# Patient Record
Sex: Female | Born: 1965 | Race: White | Hispanic: No | Marital: Married | Smoking: Never smoker
Health system: Southern US, Academic
[De-identification: ages and names within clinical notes are randomized; demographics above are authoritative.]

## PROBLEM LIST (undated history)

## (undated) DIAGNOSIS — G35 Multiple sclerosis: Secondary | ICD-10-CM

## (undated) DIAGNOSIS — F419 Anxiety disorder, unspecified: Secondary | ICD-10-CM

## (undated) DIAGNOSIS — J45909 Unspecified asthma, uncomplicated: Secondary | ICD-10-CM

## (undated) DIAGNOSIS — G2581 Restless legs syndrome: Secondary | ICD-10-CM

## (undated) DIAGNOSIS — F32A Depression, unspecified: Secondary | ICD-10-CM

## (undated) HISTORY — DX: Anxiety disorder, unspecified: F41.9

## (undated) HISTORY — DX: Multiple sclerosis (CMS HCC): G35

## (undated) HISTORY — PX: BUTTOCK MASS EXCISION: SHX1279

## (undated) HISTORY — PX: FINGER SURGERY: SHX640

## (undated) HISTORY — DX: Unspecified asthma, uncomplicated: J45.909

## (undated) HISTORY — DX: Depression, unspecified: F32.A

## (undated) HISTORY — DX: Restless legs syndrome: G25.81

---

## 1990-09-17 ENCOUNTER — Other Ambulatory Visit (HOSPITAL_COMMUNITY): Payer: Self-pay

## 2021-03-25 IMAGING — MR MRI CERVICAL SPINE WITHOUT CONTRAST
4 of 6 series · 22 of 48 positions shown · IV contrast (gadolinium)
Comparison: None available.

﻿EXAM:  [id number]   MRI CERVICAL SPINE WITHOUT CONTRAST
INDICATION: History of multiple sclerosis.
TECHNIQUE: Multiplanar multisequential MRI of the cervical spine was performed without gadolinium contrast.

[Series 5: T2 · sagittal · 3.0mm · 0.75mm/px · 6 of 15 slices shown (1 of 3)]
[im 1/15]
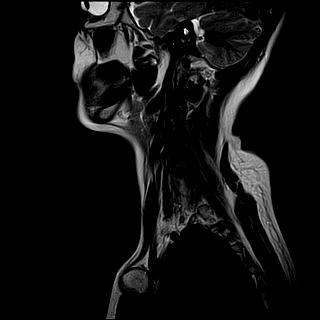
[im 3/15]
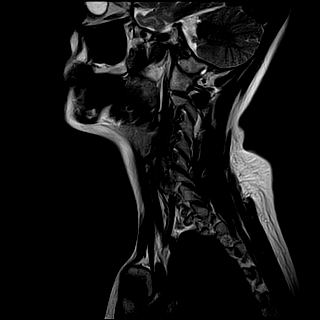
[im 6/15]
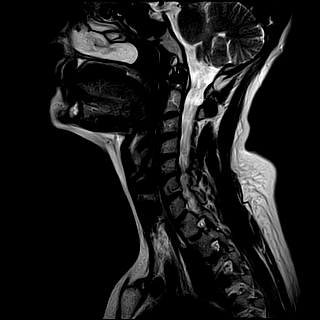
[im 9/15]
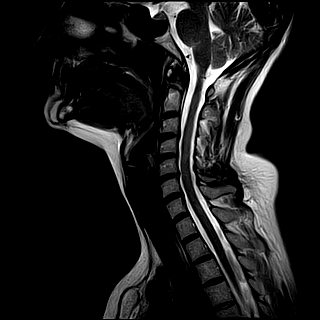
[im 12/15]
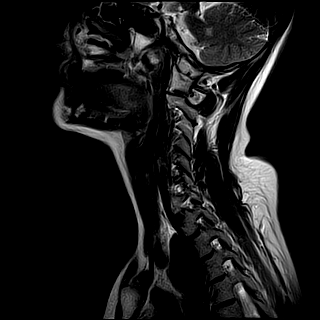
[im 15/15]
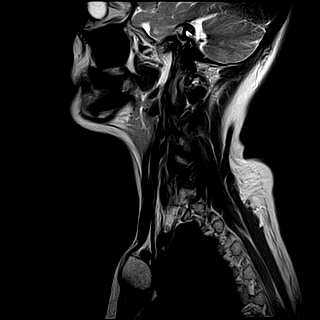

[Series 6: T1 · sagittal · 3.0mm · 0.47mm/px · 3 of 15 slices shown]
[im 3/15]
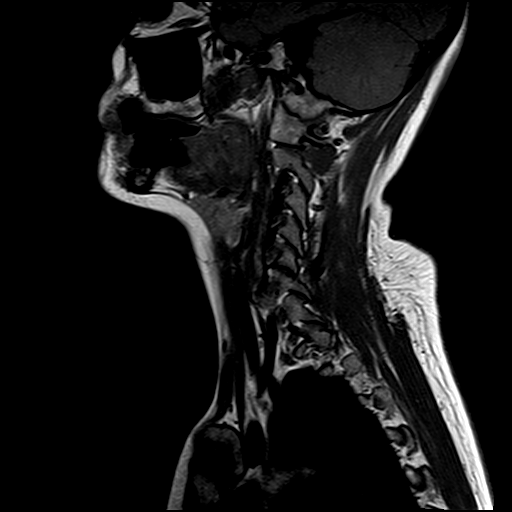
[im 8/15]
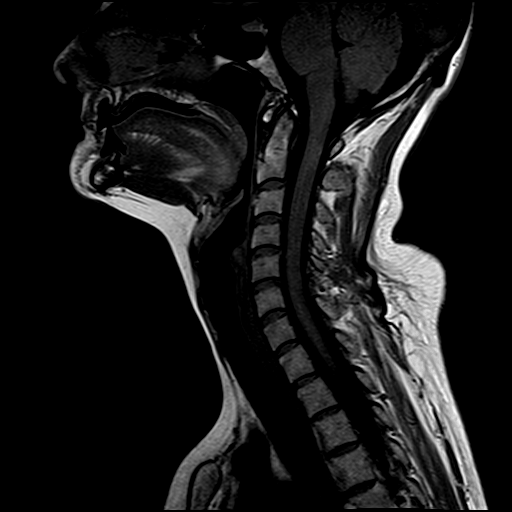
[im 12/15]
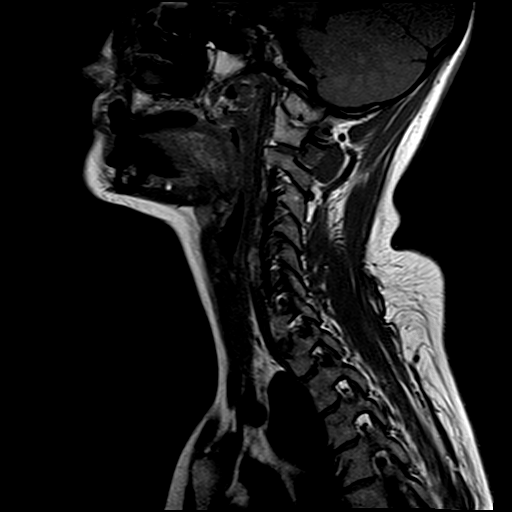

[Series 10: T2 · axial · 3.0mm · 0.39mm/px · z∈[-80,+17]mm · 8 of 18 slices shown (2 of 3)]
[im 1/18]
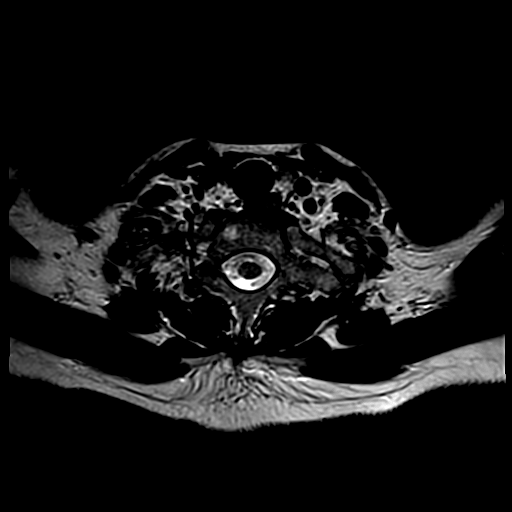
[im 3/18]
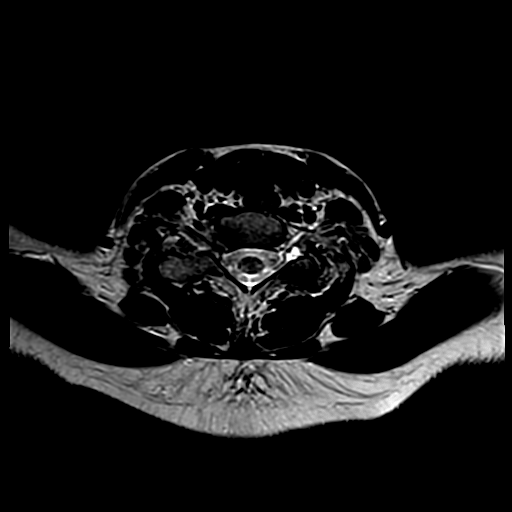
[im 5/18]
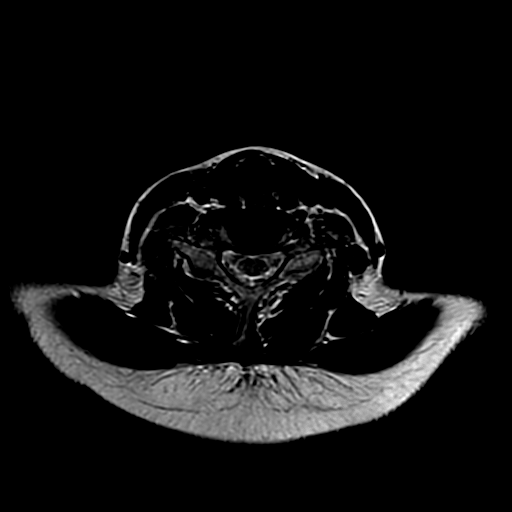
[im 8/18]
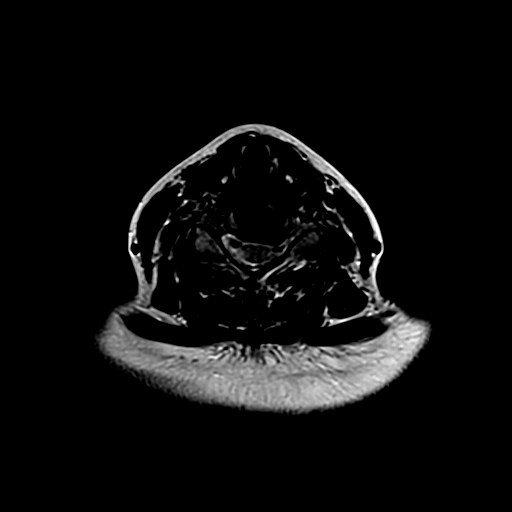
[im 10/18]
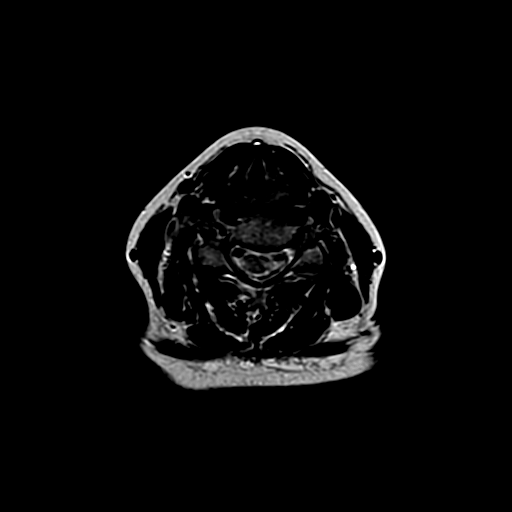
[im 13/18]
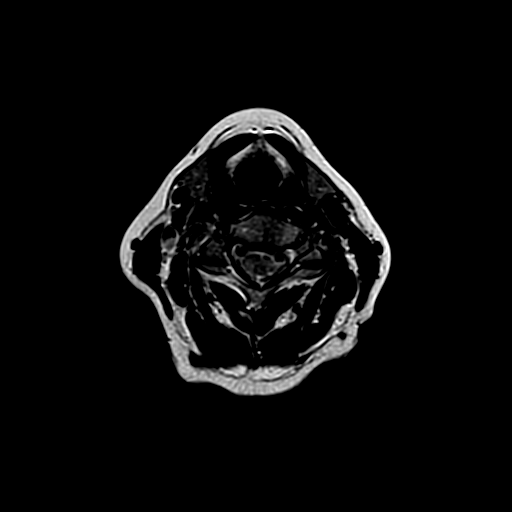
[im 15/18]
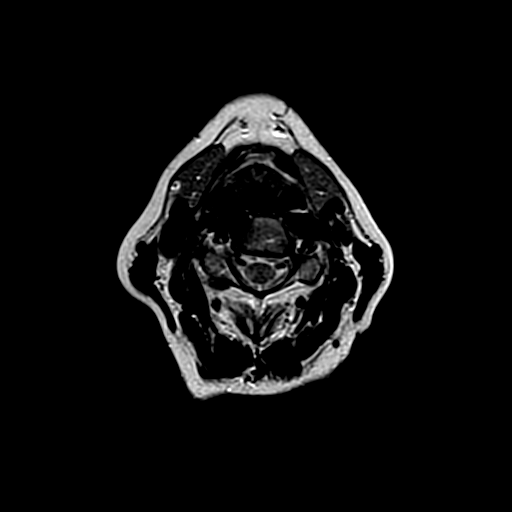
[im 18/18]
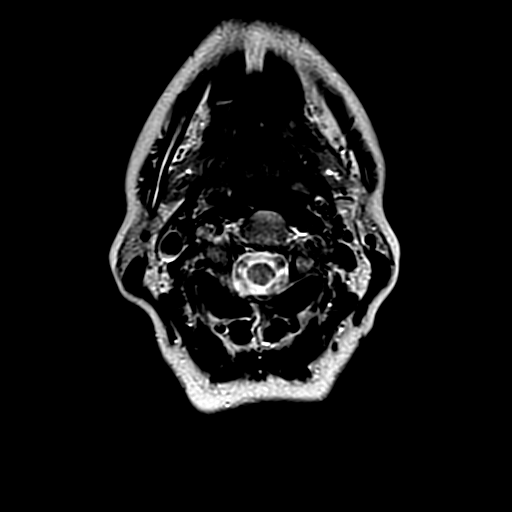

[Series 12: T2 · axial · 4.0mm · 0.35mm/px · z∈[-75,+34]mm · 5 of 26 slices shown (3 of 3)]
[im 1/26]
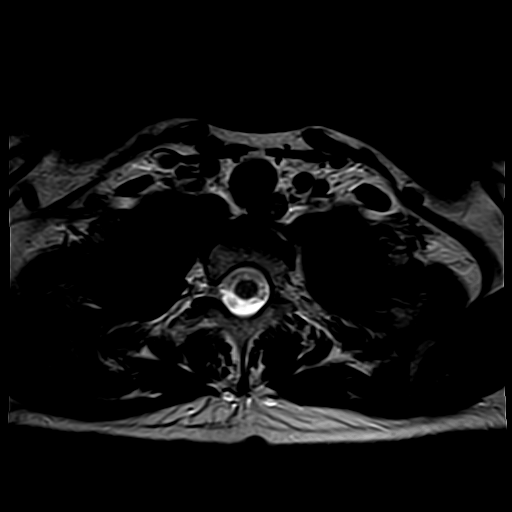
[im 5/26]
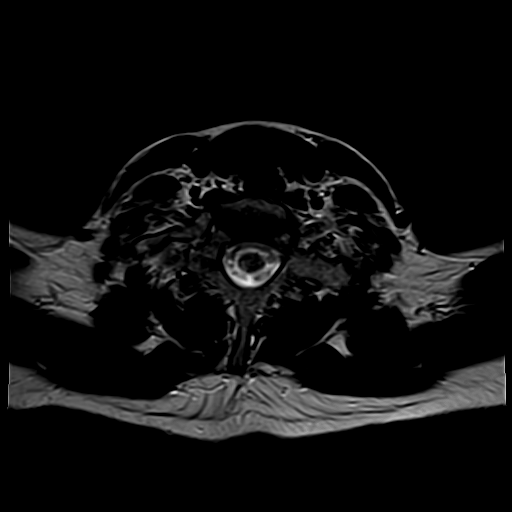
[im 7/26]
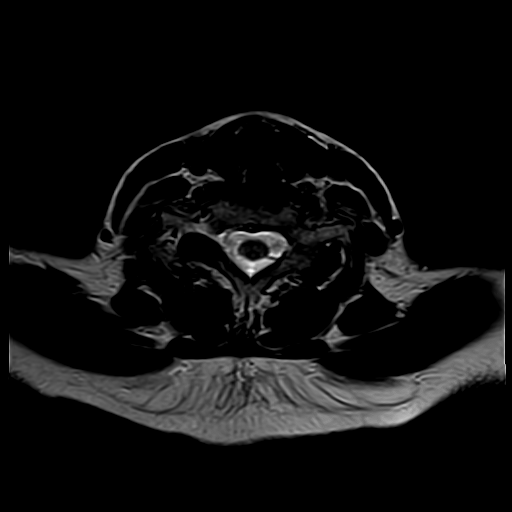
[im 14/26]
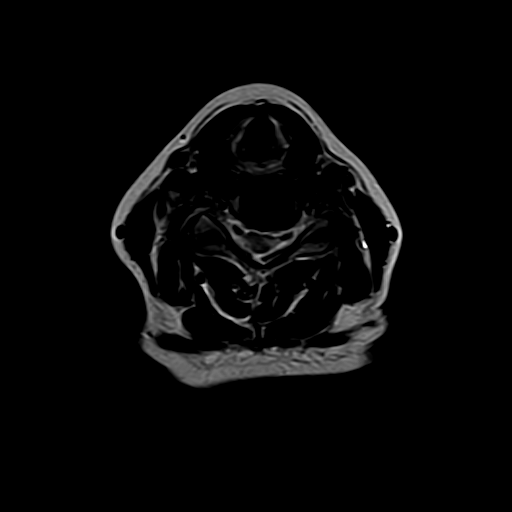
[im 23/26]
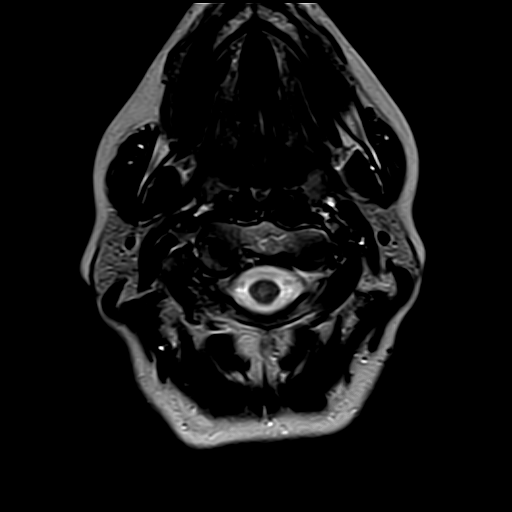

[22 of 48 positions shown; findings below may reference images not displayed]

FINDINGS: Vertebral bodies are normal in height, alignment and signal intensity. There is no acute fracture or subluxation. There is a 14 mm demyelinating plaque within the spinal cord at the level of C2 vertebral body. There is suggestion of another 7.5 mm plaque within the spinal cord at the level of C7-T1 disc space. There is also suggestion of a 5 mm demyelinating plaque within the left aspect of the spinal cord at the level of C4-5 disc space.

At C2-3 level, there is mild-to-moderate right neural foraminal stenosis from facet and uncovertebral joint hypertrophy.

C3-4 level is unremarkable.

At C4-5 level, there is a small broad-based central disc bulge with near complete effacement of the ventral CSF. There is no significant neural foraminal stenosis.

At C5-6 level, there is a small broad-based central disc bulge with near complete effacement of the ventral CSF. There is mild left neural foraminal stenosis from facet and uncovertebral joint hypertrophy.

C6-7, C7-T1 level and paraspinal soft tissues are unremarkable.
IMPRESSION: 1. At least 3 demyelinating plaques within the spinal cord as detailed above. 

2. Near complete effacement of the ventral CSF from small central disc bulges at C4-5 and C5-6 levels. 

3. Mild-to-moderate right neural foraminal stenosis at C2-3 level from facet and uncovertebral joint hypertrophy.

## 2021-03-25 IMAGING — MR MRI BRAIN WITHOUT AND WITH CONTRAST
10 of 13 series · 33 of 48 positions shown · IV contrast (gadavist)
Comparison: Brain MRI dated 11/14/2011.

﻿EXAM:  [id number]   MRI BRAIN WITHOUT AND WITH CONTRAST
INDICATION: History of multiple sclerosis, follow-up exam.
TECHNIQUE: Multiplanar multisequential MRI of the brain was performed without and with 4 mL of Gadavist.

[Series 5: DWI · axial · 5.0mm · 1.35mm/px · z∈[-46,+80]mm · 8 of 88 slices shown (1 of 3)]
[im 1/88]
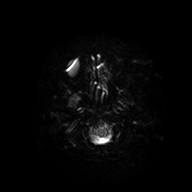
[im 13/88]
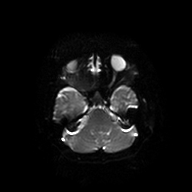
[im 25/88]
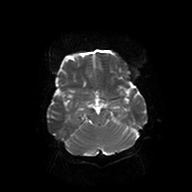
[im 38/88]
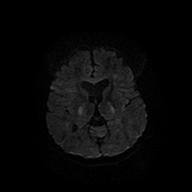
[im 50/88]
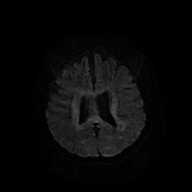
[im 63/88]
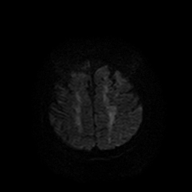
[im 75/88]
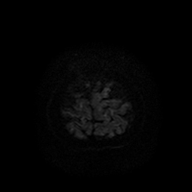
[im 88/88]
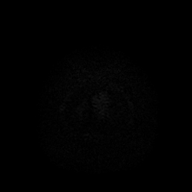

[Series 6: DWI · axial · 5.0mm · 1.35mm/px · z∈[-46,+80]mm · 2 of 22 slices shown (2 of 3)]
[im 1/22]
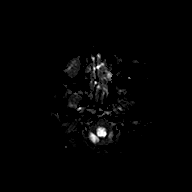
[im 22/22]
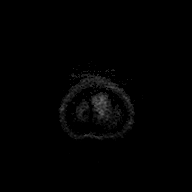

[Series 7: DWI · axial · 5.0mm · 1.35mm/px · z∈[-46,+80]mm · 2 of 22 slices shown (3 of 3)]
[im 1/22]
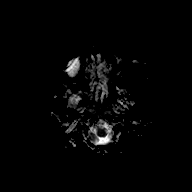
[im 22/22]
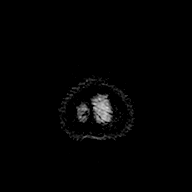

[Series 8: FLAIR · sagittal · 5.0mm · 0.75mm/px · 3 of 32 slices shown]
[im 1/32]
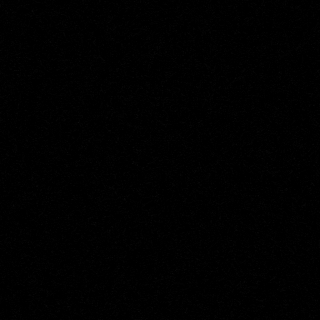
[im 16/32]
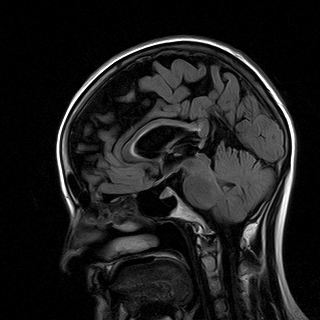
[im 32/32]
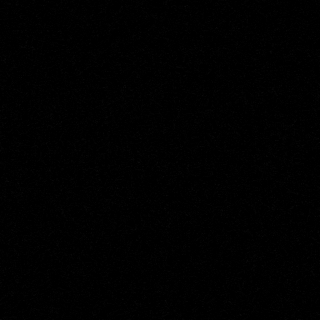

[Series 12: T1 · axial · 4.0mm · 0.43mm/px · z∈[-62,+92]mm · 4 of 36 slices shown]
[im 1/36]
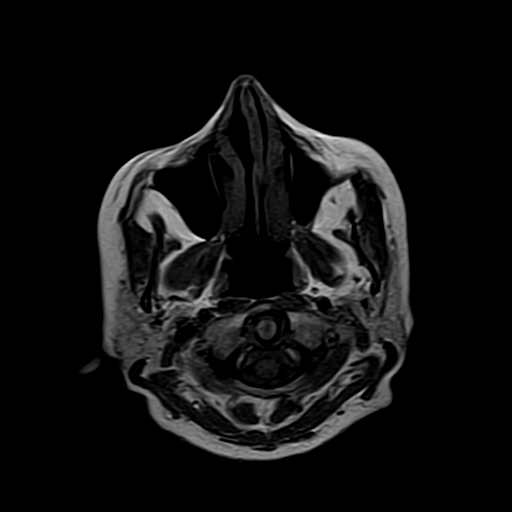
[im 12/36]
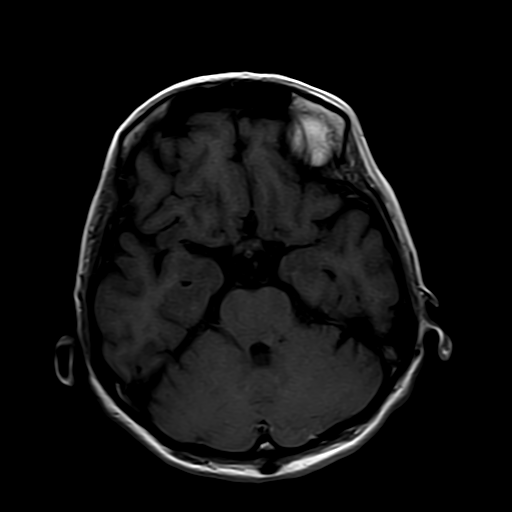
[im 24/36]
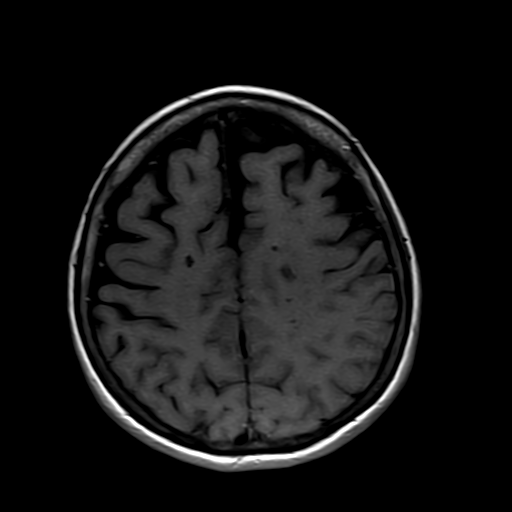
[im 36/36]
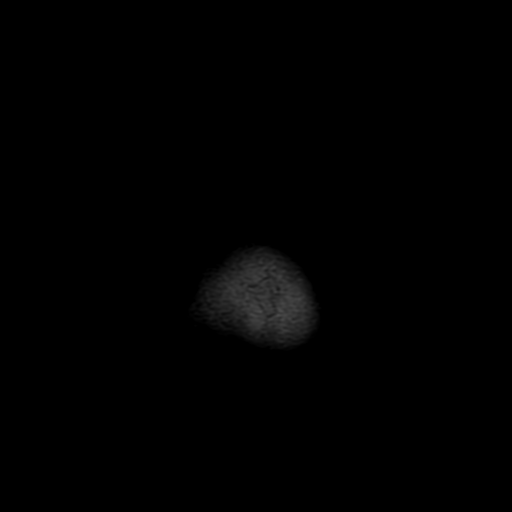

[Series 13: T2 · coronal · 5.0mm · 0.43mm/px · 3 of 28 slices shown]
[im 1/28]
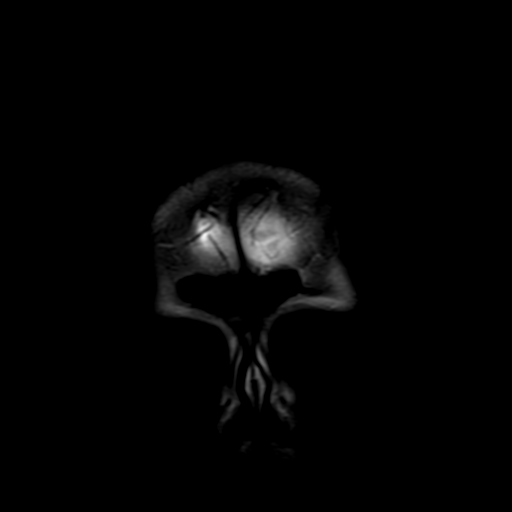
[im 14/28]
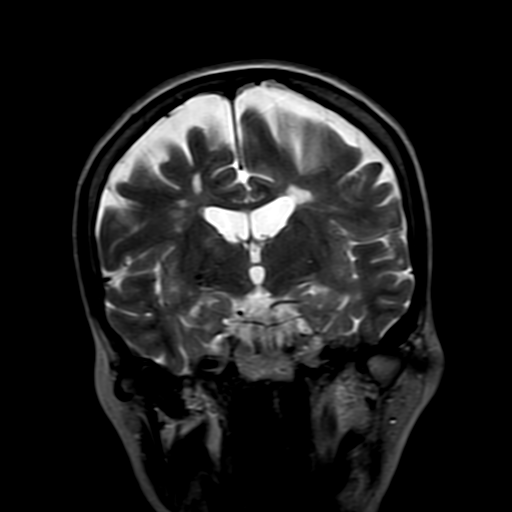
[im 28/28]
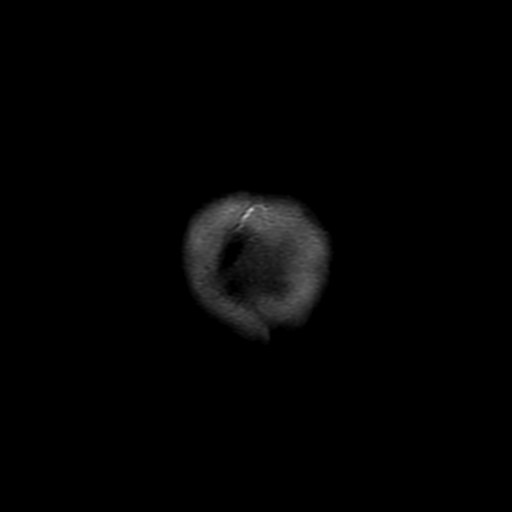

[Series 14: T1 fat-sat · coronal · 5.0mm · 0.57mm/px · 3 of 28 slices shown]
[im 1/28]
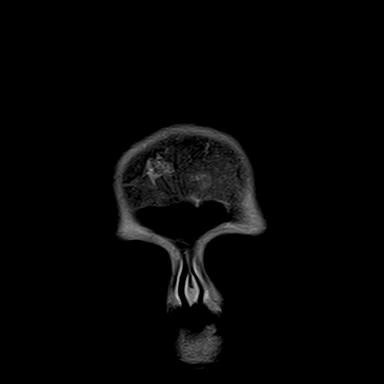
[im 14/28]
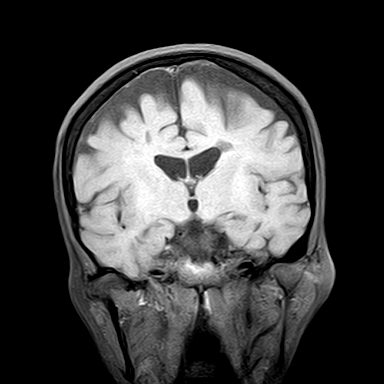
[im 28/28]
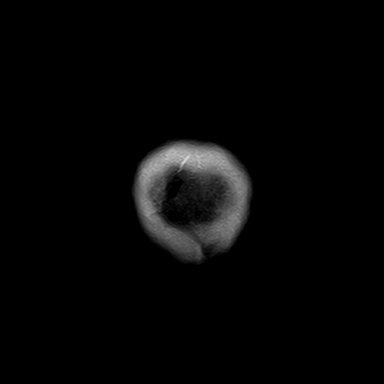

[Series 17: T1 fat-sat post-contrast · coronal · 5.0mm · 0.57mm/px · 3 of 28 slices shown (1 of 2)]
[im 1/28]
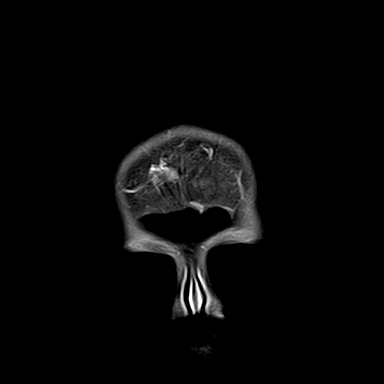
[im 14/28]
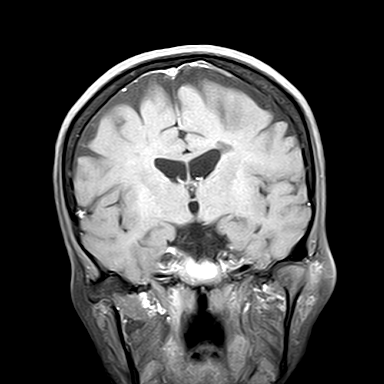
[im 28/28]
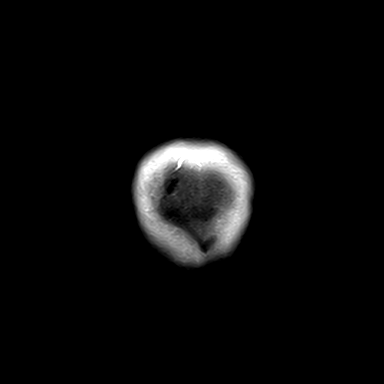

[Series 18: T1 fat-sat post-contrast · axial · 4.0mm · 0.43mm/px · 1 of 36 slices shown (2 of 2)]
[im 1/36]
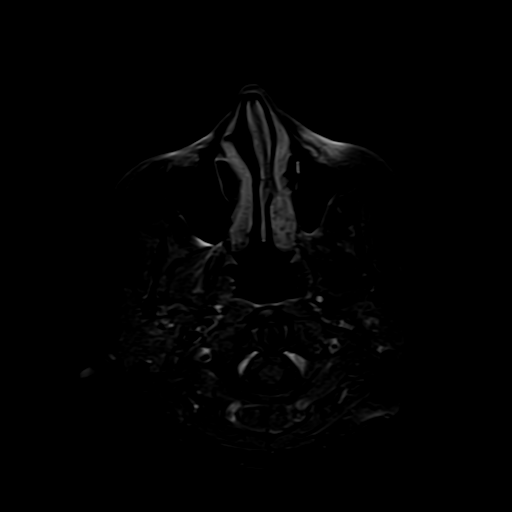

[Series 19: T1 post-contrast · axial · 4.0mm · 0.43mm/px · z∈[-62,+92]mm · 4 of 36 slices shown]
[im 1/36]
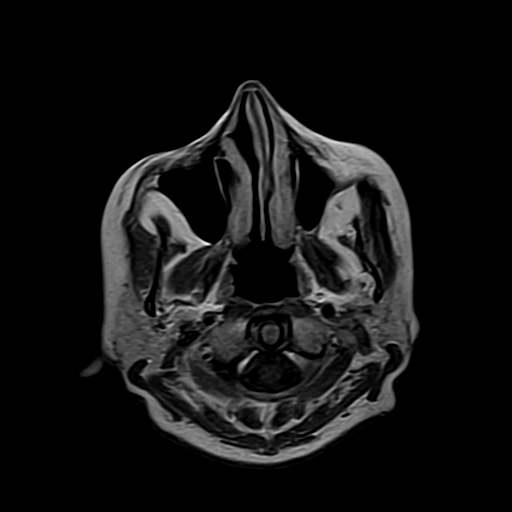
[im 12/36]
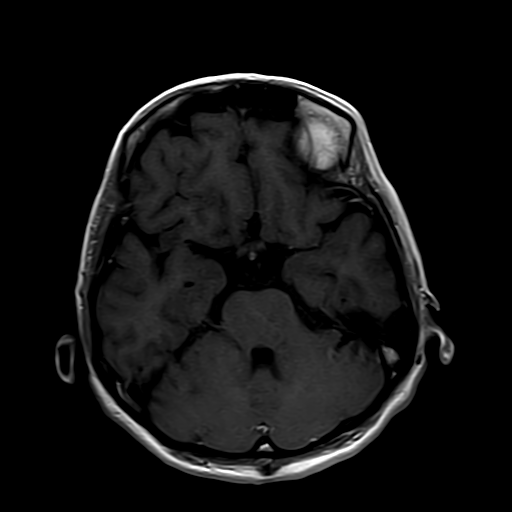
[im 24/36]
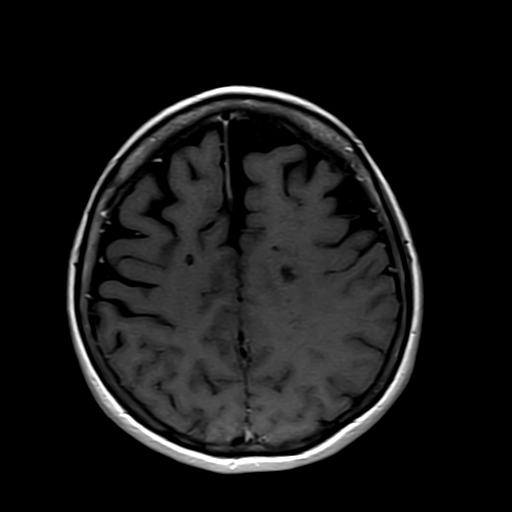
[im 36/36]
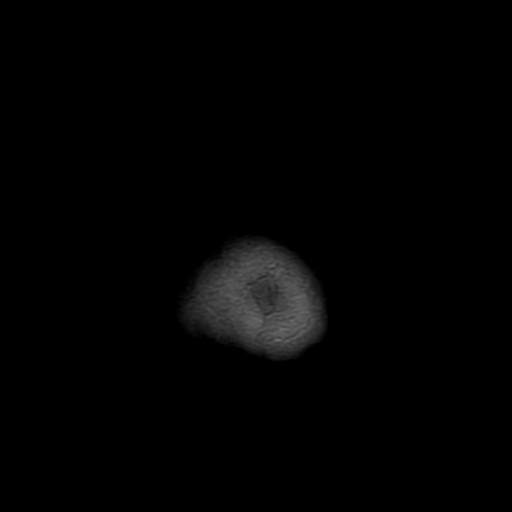

[33 of 48 positions shown; findings below may reference images not displayed]

FINDINGS: Ventricular and sulcal size is normal for the patient's age. There has been no significant change in approximately 25-30 foci of abnormal T2/FLAIR signal hyperintensity within the periventricular and subcortical white matter of each cerebral hemisphere. There is no mass effect, midline shift or intracranial hemorrhage. There is no evidence of acute infarction or prior microhemorrhages. Skull base flow voids and basal cisterns are patent. Sagittal survey of midline structures is unremarkable. Following intravenous contrast administration, there is no abnormal parenchymal or leptomeningeal enhancement. There are no extra-axial fluid collections. Visualized paranasal sinuses, mastoid air cells and orbital contents are unremarkable.
IMPRESSION: Stable disease. No evidence of active demyelination.

## 2022-12-09 ENCOUNTER — Other Ambulatory Visit (INDEPENDENT_AMBULATORY_CARE_PROVIDER_SITE_OTHER): Payer: Self-pay | Admitting: NURSE PRACTITIONER

## 2022-12-09 ENCOUNTER — Telehealth (INDEPENDENT_AMBULATORY_CARE_PROVIDER_SITE_OTHER): Payer: Self-pay | Admitting: NURSE PRACTITIONER

## 2022-12-09 DIAGNOSIS — D729 Disorder of white blood cells, unspecified: Secondary | ICD-10-CM

## 2022-12-09 NOTE — Telephone Encounter (Signed)
Called patient with appt for 12-24-22 8 am with updated labs done two days prior to this appt left message.

## 2022-12-22 ENCOUNTER — Encounter (INDEPENDENT_AMBULATORY_CARE_PROVIDER_SITE_OTHER): Payer: Self-pay | Admitting: Surgery

## 2022-12-23 ENCOUNTER — Ambulatory Visit (INDEPENDENT_AMBULATORY_CARE_PROVIDER_SITE_OTHER): Payer: 59 | Admitting: Surgery

## 2022-12-23 ENCOUNTER — Encounter (INDEPENDENT_AMBULATORY_CARE_PROVIDER_SITE_OTHER): Payer: Self-pay | Admitting: Surgery

## 2022-12-23 ENCOUNTER — Other Ambulatory Visit: Payer: Self-pay

## 2022-12-23 VITALS — BP 106/62 | HR 68 | Temp 96.4°F | Ht 60.0 in | Wt 113.6 lb

## 2022-12-23 DIAGNOSIS — M7918 Myalgia, other site: Secondary | ICD-10-CM

## 2022-12-24 ENCOUNTER — Ambulatory Visit: Payer: 59 | Attending: NURSE PRACTITIONER | Admitting: NURSE PRACTITIONER

## 2022-12-24 ENCOUNTER — Encounter (INDEPENDENT_AMBULATORY_CARE_PROVIDER_SITE_OTHER): Payer: Self-pay | Admitting: NURSE PRACTITIONER

## 2022-12-24 VITALS — BP 93/64 | HR 67 | Temp 97.9°F | Ht 60.0 in | Wt 110.9 lb

## 2022-12-24 DIAGNOSIS — R634 Abnormal weight loss: Secondary | ICD-10-CM | POA: Insufficient documentation

## 2022-12-24 DIAGNOSIS — D72810 Lymphocytopenia: Secondary | ICD-10-CM

## 2022-12-24 DIAGNOSIS — M2550 Pain in unspecified joint: Secondary | ICD-10-CM | POA: Insufficient documentation

## 2022-12-24 DIAGNOSIS — F32A Depression, unspecified: Secondary | ICD-10-CM | POA: Insufficient documentation

## 2022-12-24 DIAGNOSIS — Z6821 Body mass index (BMI) 21.0-21.9, adult: Secondary | ICD-10-CM

## 2022-12-24 DIAGNOSIS — G4700 Insomnia, unspecified: Secondary | ICD-10-CM | POA: Insufficient documentation

## 2022-12-24 DIAGNOSIS — N951 Menopausal and female climacteric states: Secondary | ICD-10-CM | POA: Insufficient documentation

## 2022-12-24 DIAGNOSIS — K219 Gastro-esophageal reflux disease without esophagitis: Secondary | ICD-10-CM | POA: Insufficient documentation

## 2022-12-24 DIAGNOSIS — R42 Dizziness and giddiness: Secondary | ICD-10-CM | POA: Insufficient documentation

## 2022-12-24 DIAGNOSIS — M898X9 Other specified disorders of bone, unspecified site: Secondary | ICD-10-CM | POA: Insufficient documentation

## 2022-12-24 DIAGNOSIS — J45909 Unspecified asthma, uncomplicated: Secondary | ICD-10-CM | POA: Insufficient documentation

## 2022-12-24 DIAGNOSIS — R5383 Other fatigue: Secondary | ICD-10-CM | POA: Insufficient documentation

## 2022-12-24 DIAGNOSIS — D72819 Decreased white blood cell count, unspecified: Secondary | ICD-10-CM | POA: Insufficient documentation

## 2022-12-24 DIAGNOSIS — F419 Anxiety disorder, unspecified: Secondary | ICD-10-CM | POA: Insufficient documentation

## 2022-12-24 DIAGNOSIS — G35 Multiple sclerosis: Secondary | ICD-10-CM | POA: Insufficient documentation

## 2022-12-24 NOTE — H&P (Addendum)
HEMATOLOGY/ONCOLOGY, [facility],   [facility]  [address]  [place], [place] [address]       New Patient History and Physical    Name: Joanne Yoder MRN:  [id number]   Date: 12/24/2022 Age: 57 y.o.  DOB: 10-09-1966       Referring Physician: No ref. provider found  Primary Care Provider: Carmell Austria, PA    Reason for visit/consultation:   New Patient        History of Present Illness:  The patient is a 57 year old female that has been referred here today by her PCP for the evaluation and management of Leukopenia. Commodities to include GERD, morphine sulphate, Anxiety, Depression, insomnia, Asthma and allergies which is being treated per Dr. Sherilyn Banker. There is also history of Multiple Sclerosis  and she is under the care of Dr. Andee Poles.  Review of medical records show that her WBC had declined to 2.7 in 11/2022 and previously in March of 2023 the WBC was 3.0.   The patient reports that she was referred today for a low white blood cell count. She denies any recent illness, fever, chills or night sweats.     Past Medical History  Past Medical History:   Diagnosis Date    Anxiety     Asthma     Depression     RLS (restless legs syndrome)       Past Surgical History:   Procedure Laterality Date    BUTTOCK MASS EXCISION      FINGER SURGERY      HX CESAREAN SECTION        Family Medical History:       Problem Relation (Age of Onset)    No Known Problems Mother, Father            Social History  Occupation:   Social History     Occupational History    Not on file    [place] [id number]  Social History     Socioeconomic History    Marital status: Widowed   Tobacco Use    Smoking status: Never    Smokeless tobacco: Never   Vaping Use    Vaping Use: Never used   Substance and Sexual Activity    Alcohol use: Never    Drug use: Never       No Known Allergies  Current Outpatient Medications   Medication Sig    diroximel fumarate (VUMERITY) 231 mg Oral Capsule, Delayed  Release(E.C.) Take 2 Capsules (462 mg total) by mouth    famotidine (PEPCID) 40 mg Oral Tablet     FLUoxetine (PROZAC) 20 mg Oral Capsule Take 1 Capsule (20 mg total) by mouth Once a day    Ibuprofen (MOTRIN) 600 mg Oral Tablet Take 1 Tablet (600 mg total) by mouth Twice daily    metoprolol succinate (TOPROL-XL) 50 mg Oral Tablet Sustained Release 24 hr Take 1 Tablet (50 mg total) by mouth    omeprazole (PRILOSEC) 40 mg Oral Capsule, Delayed Release(E.C.) Take 1 Capsule (40 mg total) by mouth    pregabalin (LYRICA) 75 mg Oral Capsule Take 1 Capsule (75 mg total) by mouth Three times a day    zolpidem (AMBIEN) 10 mg Oral Tablet            ROS:   Review of Systems   Constitutional:  Positive for fatigue and unexpected weight change (over the last 3years aproximtely 15lbs). Negative for appetite change.  HENT:  Negative.     Eyes: Negative.    Respiratory:  Negative for cough and shortness of breath.    Cardiovascular:  Negative for chest pain and palpitations.   Gastrointestinal:  Negative for abdominal pain, blood in stool, constipation, diarrhea, nausea and vomiting.   Endocrine: Positive for hot flashes.   Genitourinary:  Positive for difficulty urinating.    Musculoskeletal:  Positive for arthralgias.        Chronic bone pain    Neurological:  Positive for dizziness and light-headedness. Negative for headaches.   Hematological:  Negative for adenopathy. Bruises/bleeds easily.   Psychiatric/Behavioral:  Positive for depression (controlled on medication) and sleep disturbance (controlled on medication). Negative for suicidal ideas. The patient is nervous/anxious (controlled on medication).        Physical Examination:  BP 93/64 (Site: Left, Patient Position: Sitting, Cuff Size: Adult)   Pulse 67   Temp 36.6 C (97.9 F) (Thermal Scan)   Ht 1.524 m (5')   Wt 50.3 kg (110 lb 14.4 oz)   BMI 21.66 kg/m       Physical Exam  Vitals reviewed.   Constitutional:       General: She is not in acute distress.  HENT:       Head: Normocephalic.      Nose: Nose normal.      Mouth/Throat:      Mouth: Mucous membranes are moist.   Eyes:      General: No scleral icterus.  Cardiovascular:      Rate and Rhythm: Normal rate and regular rhythm.      Pulses: Normal pulses.      Heart sounds: Normal heart sounds. No murmur heard.     No friction rub.   Pulmonary:      Effort: Pulmonary effort is normal.      Breath sounds: Normal breath sounds. No wheezing or rhonchi.   Abdominal:      General: Abdomen is flat. Bowel sounds are normal.      Palpations: Abdomen is soft. There is no mass.      Tenderness: There is no abdominal tenderness.      Hernia: No hernia is present.   Musculoskeletal:         General: Normal range of motion.      Cervical back: Normal range of motion.      Right lower leg: No edema.      Left lower leg: No edema.   Skin:     General: Skin is warm and dry.      Capillary Refill: Capillary refill takes less than 2 seconds.      Findings: No bruising or erythema.   Neurological:      General: No focal deficit present.      Mental Status: She is alert and oriented to person, place, and time. Mental status is at baseline.   Psychiatric:         Mood and Affect: Mood normal.         Behavior: Behavior normal.         Thought Content: Thought content normal.         Judgment: Judgment normal.        ECOG Status: 0 - Fully active, able to carry on all pre-disease performance without restriction.       LABS:     11/27/2022 WBC 2.7, RBC 4.14, Hgb 12.5, Hct 36.1, MCV 87, Plt's 287,000, ANC 1.7, Lymphocytes 0.6, AST 17, ALT 9,  B12 373    08/27/2022 WBC 3.0 , RBC 4.06, Hgb 12.2, Hct 36.5, MCV 90, Plt's 258,000, ANC 2.1, Lymphocytes 0.4, AST 14  ALT 7    Pathology:  None to review     Radiology:    CXR 02/13/2022 The lungs are clear. No pleural effusion or pneumothorax.The heart size and mediastinal contours are normal. No acute osseous abnormality.     Assessment/Plan:     Problem List Items Addressed This Visit           Hematologic/Lymphatic    Leukopenia - Primary    Relevant Orders    CBC/DIFF    GIEMSA STAIN, WITH PATHOLOGIST REVIEW    THYROID STIMULATING HORMONE WITH FREE T4 REFLEX    IRON TRANSFERRIN AND TIBC    FERRITIN    CBC/DIFF    GIEMSA STAIN, WITH PATHOLOGIST REVIEW    THYROID STIMULATING HORMONE WITH FREE T4 REFLEX    IRON TRANSFERRIN AND TIBC    FERRITIN       Other    Weight loss    Relevant Orders    THYROID STIMULATING HORMONE WITH FREE T4 REFLEX    IRON TRANSFERRIN AND TIBC    FERRITIN     Oncology History    No history exists.        PLAN:  All relative external and internal medical records were reviewed including available H&Ps, progress notes, procedure notes, imaging's, laboratories, and pathology   We will repeat CBC with peripheral smear for Leukopenia to determine if this is transient neutropenia due to Multiple Sclerosis therapy of Vumerity. In 2 weeks  Due to weight loss and inability to loose weight I will do Thyroid studies   With MCV dropping slowly I will add on iron labs as well for 2 weeks.  Patient will return in 4 weeks with labs in 2 weeks cbc, peripheral smear, Iron panel, Thyroid studies          The patient was given the opportunity to ask questions, and these were answered to their satisfaction. Darlyn Read  is welcome to call with any questions or concerns at any point.        Return in about 4 weeks (around 01/21/2023).    On the day of the encounter, I spent a total of  49  minutes on this patient encounter including review of historical information, examination, documentation and post-visit activities.         Evie Lacks APRN, FNP-C 2/14/202408:35    You can see your note(s) in [facility]. It is common for you to encounter certain medical terminology which may be unfamiliar to you. You might see results before your provider does so please give at least 2 business days for review. Please have this understanding, that NOT all abnormal results are significant. Our office will  contact you for any urgent or emergent action if necessary. If you have any questions or concerns, feel free to send a MyChart message or call the office. Please call with any new or concerning symptoms.       CC:  Jamie Carosi, PA  [address]  [place] [address]    No primary care provider on file.    Portions of this note may be dictated using voice recognition software or a dictation service. Variances in spelling and vocabulary are possible and unintentional. Not all errors are caught/corrected. Please notify the Thereasa Parkin if any discrepancies are noted or if the meaning  of any statement is not clear.

## 2022-12-26 ENCOUNTER — Encounter (INDEPENDENT_AMBULATORY_CARE_PROVIDER_SITE_OTHER): Payer: Self-pay | Admitting: Surgery

## 2022-12-26 NOTE — Progress Notes (Signed)
GENERAL SURGERY, [facility]  [address]  [place] [address]    History and Physical     Name: Joanne Yoder MRN:  [id number]   Date: 12/23/2022 Age: 57 y.o.            Reason for Visit: General (Gluteal implants)    History of Present Illness  Ms. Mathena presents today for evaluation gluteal pain.  The patient was had excision of gluteal masses in the past.  She states that when she sits on this area and causes discomfort.      Review of the result(s) of each unique test:  Patient underwent diagnostic testing ( none ) prior to this dates visit.  I have personally reviewed the results and that serves as a component of the medical decision making for this encounter       Review of prior external note(s) from each unique source:  Patients referral to this office including a recent assessment by the referring provider.  This was reviewed by me for this unique office visit for the indication and intent of the referral as well as any pertinent medical or surgical history relevant to the patients independent evaluation by me today.      Patient History  Past Medical History:   Diagnosis Date    Anxiety     Asthma     Depression     Multiple sclerosis (CMS HCC)     RLS (restless legs syndrome)          Past Surgical History:   Procedure Laterality Date    BUTTOCK MASS EXCISION      FINGER SURGERY      HX CESAREAN SECTION           Current Outpatient Medications   Medication Sig    diroximel fumarate (VUMERITY) 231 mg Oral Capsule, Delayed Release(E.C.) Take 2 Capsules (462 mg total) by mouth    famotidine (PEPCID) 40 mg Oral Tablet     FLUoxetine (PROZAC) 20 mg Oral Capsule Take 1 Capsule (20 mg total) by mouth Once a day    Ibuprofen (MOTRIN) 600 mg Oral Tablet Take 1 Tablet (600 mg total) by mouth Twice daily    metoprolol succinate (TOPROL-XL) 50 mg Oral Tablet Sustained Release 24 hr Take 1 Tablet (50 mg total) by mouth    omeprazole (PRILOSEC) 40 mg Oral Capsule, Delayed  Release(E.C.) Take 1 Capsule (40 mg total) by mouth    pregabalin (LYRICA) 75 mg Oral Capsule Take 1 Capsule (75 mg total) by mouth Three times a day    zolpidem (AMBIEN) 10 mg Oral Tablet      No Known Allergies  Family Medical History:       Problem Relation (Age of Onset)    No Known Problems Mother, Father            Social History     Tobacco Use    Smoking status: Never    Smokeless tobacco: Never   Vaping Use    Vaping Use: Never used   Substance Use Topics    Alcohol use: Never    Drug use: Never            Physical Examination:  Vitals:    12/23/22 1444   BP: 106/62   Pulse: 68   Temp: (!) 35.8 C (96.4 F)   SpO2: 98%   Weight: 51.5 kg (113 lb 9.6 oz)   Height: 1.524 m (5')   BMI:  22.23        General: appropriate for age. in no acute distress.    Vital signs are present above and have been reviewed by me     HEENT: Atraumatic, Normocephalic. PERRLA. EOMI. Nose clear. Throat clear    Lungs: Nonlabored breathing with symmetric expansion. Clear to auscultation bilaterally    Heart:Regular wth respect to rate and rythmn.    Abdomen:Soft. Nontender. Nondistended and benign    Gluteal examination shows areas of previous surgery but no obvious palpable masses    Extremities: Grossly normal. No major deformities     Neuro:  Grossly normal motor and sensory function    Psychiatric: Alert and oriented to person, place, and time. affect appropriate      Assessment and Plan  Gluteal pain; we will schedule patient for CT scan of the pelvis to further evaluate the source of her pain.    Follow Up:  No follow-ups on file.      ICD-10-CM    1. Gluteal pain  M79.18 CT PELVIS W IV CONTRAST          Aarushi Hemric B Lars Jeziorski, MD ,MBA,FACS    I appreciate the opportunity to be involved in the care of your patients.  If you have any questions or concerns regarding this encounter, please do not hesitate to contact me at your convenience.      This note may have been partially generated using MModal Fluency Direct system, and there may  be some incorrect words, spellings, and punctuation that were not noted in checking the note before saving, though effort was made to avoid such errors.

## 2023-01-02 ENCOUNTER — Other Ambulatory Visit: Payer: Self-pay

## 2023-01-02 ENCOUNTER — Emergency Department
Admission: EM | Admit: 2023-01-02 | Discharge: 2023-01-02 | Disposition: A | Discharge status: Home / Self Care | Payer: No Typology Code available for payment source | Attending: Nurse Practitioner | Admitting: Nurse Practitioner

## 2023-01-02 ENCOUNTER — Encounter (HOSPITAL_BASED_OUTPATIENT_CLINIC_OR_DEPARTMENT_OTHER): Payer: Self-pay

## 2023-01-02 DIAGNOSIS — T25222A Burn of second degree of left foot, initial encounter: Secondary | ICD-10-CM | POA: Insufficient documentation

## 2023-01-02 DIAGNOSIS — T2220XA Burn of second degree of shoulder and upper limb, except wrist and hand, unspecified site, initial encounter: Secondary | ICD-10-CM

## 2023-01-02 DIAGNOSIS — X102XXA Contact with fats and cooking oils, initial encounter: Secondary | ICD-10-CM | POA: Insufficient documentation

## 2023-01-02 DIAGNOSIS — Y990 Civilian activity done for income or pay: Secondary | ICD-10-CM | POA: Insufficient documentation

## 2023-01-02 DIAGNOSIS — T22232A Burn of second degree of left upper arm, initial encounter: Secondary | ICD-10-CM | POA: Insufficient documentation

## 2023-01-02 MED ORDER — BACITRACIN ZINC 500 UNIT/GRAM TOPICAL OINTMENT
TOPICAL_OINTMENT | Freq: Every day | CUTANEOUS | 0 refills | Status: AC
Start: 2023-01-02 — End: 2023-01-09

## 2023-01-02 MED ORDER — BACITRACIN ZINC 500 UNIT-POLYMYXIN B 10,000 UNIT/GRAM TOP OINT PACKET
1.0000 | TOPICAL_OINTMENT | CUTANEOUS | Status: DC
Start: 2023-01-02 — End: 2023-01-02

## 2023-01-02 MED ORDER — BACITRACIN 500 UNIT/G OINTMENT TUBE
TOPICAL_OINTMENT | CUTANEOUS | Status: AC
Start: 2023-01-02 — End: 2023-01-02
  Filled 2023-01-02: qty 28.4

## 2023-01-02 MED ORDER — BACITRACIN 500 UNIT/G OINTMENT TUBE
TOPICAL_OINTMENT | CUTANEOUS | Status: AC
Start: 2023-01-02 — End: 2023-01-02

## 2023-01-02 MED ORDER — IBUPROFEN 600 MG TABLET
600.0000 mg | ORAL_TABLET | Freq: Four times a day (QID) | ORAL | 0 refills | Status: DC | PRN
Start: 2023-01-02 — End: 2023-01-09

## 2023-01-02 NOTE — ED Provider Notes (Signed)
[facility], [facility] Emergency Department  ED Primary Provider Note  History of Present Illness   Chief Complaint   Patient presents with    Burn Thermal     Joanne Yoder is a 57 y.o. female who had concerns including Burn Thermal.  Arrival: The patient arrived by Car      This 57 year old female presents to the emergency department complaining of a burn to the left upper arm and a burn to the left heel.  She reports that she was at work and lifted a pan of hot grease which spilled causing a burn to the left upper arm in the left heel.  She states that she thought that it burned her left hip however she does not have a burn wound in that area.  She denies inhalation of fumes.  She denies shortness of breath.        History Reviewed This Encounter: Medical History  Surgical History  Family History  Social History    Physical Exam   ED Triage Vitals [01/02/23 1251]   BP (Non-Invasive) 120/89   Heart Rate 69   Respiratory Rate 18   Temperature 36.1 C (97 F)   SpO2 98 %   Weight    Height      Physical Exam  Vitals and nursing note reviewed.   Constitutional:       General: She is not in acute distress.     Appearance: She is normal weight. She is not ill-appearing, toxic-appearing or diaphoretic.   HENT:      Head: Normocephalic and atraumatic.      Right Ear: External ear normal.      Left Ear: External ear normal.      Nose: Nose normal.      Mouth/Throat:      Mouth: Mucous membranes are moist.      Pharynx: Oropharynx is clear.   Eyes:      Extraocular Movements: Extraocular movements intact.      Conjunctiva/sclera: Conjunctivae normal.      Pupils: Pupils are equal, round, and reactive to light.   Cardiovascular:      Rate and Rhythm: Normal rate and regular rhythm.      Pulses: Normal pulses.      Heart sounds: Normal heart sounds.   Pulmonary:      Effort: Pulmonary effort is normal. No respiratory distress.      Breath sounds: Normal breath sounds. No stridor. No  wheezing, rhonchi or rales.   Chest:      Chest wall: No tenderness.   Abdominal:      General: Abdomen is flat. Bowel sounds are normal. There is no distension.      Palpations: Abdomen is soft. There is no mass.      Tenderness: There is no abdominal tenderness. There is no right CVA tenderness, left CVA tenderness, guarding or rebound.      Hernia: No hernia is present.   Musculoskeletal:         General: Signs of injury (Left bicep and left heel) present. No swelling or tenderness. Normal range of motion.      Cervical back: Normal range of motion and neck supple.      Comments: Second-degree burn that is about 6 cm in circumference noted to the left upper arm over the biceps region.  Second-degree burn about 4 cm in circumference with blistering noted to the left heel.  No burns noted to the left hip.  Skin:     General: Skin is warm and dry.      Capillary Refill: Capillary refill takes less than 2 seconds.      Coloration: Skin is not jaundiced or pale.      Findings: No bruising or erythema.      Comments: Second-degree burn to left bicep and left heel.   Neurological:      General: No focal deficit present.      Mental Status: She is alert and oriented to person, place, and time.      Cranial Nerves: No cranial nerve deficit.      Sensory: No sensory deficit.   Psychiatric:         Mood and Affect: Mood normal.         Behavior: Behavior normal.       Patient Data     Labs Ordered/Reviewed - No data to display  No orders to display     Medical Decision Making          Medical Decision Making  This 57 year old female presents emergency department stating that she received a grease burn at work while she was lifting a pan of grease which spilled onto her left upper arm and left heel.  She states that the grease did spill on her left hip however she does not have a burn in his area.  Physical exam revealed a 6 cm in circumference blister type burn to the left upper extremity and a 4 cm in circumference  blister like burn to the left heel.  She was treated here in the emergency department with bacitracin ointment application to the burns with a nonsterile dressing.  She will be discharged and treated on outpatient basis with bacitracin topical ointment once daily and Motrin 600 mg every 6 hours as needed for pain.  She was instructed to follow-up with her primary care provider 1-3 days and return here on Monday for wound recheck.  She was advised to return to the emergency department sooner if worse or as needed.    Problems Addressed:  Blisters with epidermal loss due to burn (second degree) of upper arm, left, initial encounter: acute illness or injury  Partial thickness burn of left foot, initial encounter: acute illness or injury    Risk  OTC drugs.  Prescription drug management.      ED Course as of 01/02/23 1402   Fri Jan 02, 2023   1348 Alert x3 and in no acute distress.  The diagnosis and treatment plan was explained to the patient verbalized understanding of the discharge instructions.         Medications Administered in the ED   bacitracin 500 units/gram topical ointment tube ( Topical Given 01/02/23 1328)     Clinical Impression   Blisters with epidermal loss due to burn (second degree) of upper arm, left, initial encounter (Primary)   Partial thickness burn of left foot, initial encounter       Disposition: Discharged

## 2023-01-02 NOTE — Discharge Instructions (Signed)
Drink plenty of water.  Leave the dressings in place for 24 hours.  Apply bacitracin to the burns daily.  Follow-up with your primary care provider in 1-3 days by calling their office within the next 24 hours to arrange a follow-up appointment.  Follow-up here on Monday for wound recheck.  Return to the emergency department sooner if worse or as needed.  We hope you feel better.

## 2023-01-02 NOTE — ED Nurses Note (Signed)
NP to triage to see Pt. New order noted. Bacitracin applied. Adaptic , 4 x 4 and Kerlix applied.

## 2023-01-02 NOTE — ED Triage Notes (Signed)
Pt work. Spilled  hot grease onto left upper arm and left heel . Blisters noted at both sites. See photos

## 2023-01-02 NOTE — ED Nurses Note (Signed)
Patient given discharge instructions. Prescriptions escribed to preferred pharmacy. Encouraged follow up with PCP if symptoms persist or get worse. All questions answered. Verbalized understanding and denied further needs.

## 2023-01-09 ENCOUNTER — Encounter (HOSPITAL_BASED_OUTPATIENT_CLINIC_OR_DEPARTMENT_OTHER): Payer: Self-pay

## 2023-01-09 ENCOUNTER — Other Ambulatory Visit: Payer: Self-pay

## 2023-01-09 ENCOUNTER — Emergency Department
Admission: EM | Admit: 2023-01-09 | Discharge: 2023-01-09 | Disposition: A | Discharge status: Home / Self Care | Payer: 59 | Attending: Emergency Medicine | Admitting: Emergency Medicine

## 2023-01-09 DIAGNOSIS — X102XXD Contact with fats and cooking oils, subsequent encounter: Secondary | ICD-10-CM

## 2023-01-09 DIAGNOSIS — T25212S Burn of second degree of left ankle, sequela: Secondary | ICD-10-CM | POA: Insufficient documentation

## 2023-01-09 DIAGNOSIS — T25212D Burn of second degree of left ankle, subsequent encounter: Secondary | ICD-10-CM

## 2023-01-09 DIAGNOSIS — T2220XS Burn of second degree of shoulder and upper limb, except wrist and hand, unspecified site, sequela: Secondary | ICD-10-CM

## 2023-01-09 DIAGNOSIS — Z23 Encounter for immunization: Secondary | ICD-10-CM | POA: Insufficient documentation

## 2023-01-09 DIAGNOSIS — T2220XD Burn of second degree of shoulder and upper limb, except wrist and hand, unspecified site, subsequent encounter: Secondary | ICD-10-CM

## 2023-01-09 DIAGNOSIS — X102XXS Contact with fats and cooking oils, sequela: Secondary | ICD-10-CM | POA: Insufficient documentation

## 2023-01-09 MED ORDER — TRAMADOL 50 MG TABLET
ORAL_TABLET | ORAL | Status: AC
Start: 2023-01-09 — End: 2023-01-09
  Filled 2023-01-09: qty 2

## 2023-01-09 MED ORDER — DIPHTH,PERTUSSIS(ACEL),TETANUS 2.5 LF UNIT-8 MCG-5 LF/0.5ML IM SYRINGE
INJECTION | INTRAMUSCULAR | Status: AC
Start: 2023-01-09 — End: 2023-01-09
  Filled 2023-01-09: qty 0.5

## 2023-01-09 MED ORDER — DIPHTH,PERTUSSIS(ACEL),TETANUS 2.5 LF UNIT-8 MCG-5 LF/0.5ML IM SYRINGE
0.5000 mL | INJECTION | INTRAMUSCULAR | Status: AC
Start: 2023-01-09 — End: 2023-01-09
  Administered 2023-01-09: 0.5 mL via INTRAMUSCULAR

## 2023-01-09 MED ORDER — TRAMADOL 50 MG TABLET
100.0000 mg | ORAL_TABLET | ORAL | Status: AC
Start: 2023-01-09 — End: 2023-01-09
  Administered 2023-01-09: 100 mg via ORAL

## 2023-01-09 MED ORDER — SILVER SULFADIAZINE 1 % TOPICAL CREAM
TOPICAL_CREAM | CUTANEOUS | Status: AC
Start: 2023-01-09 — End: 2023-01-09
  Filled 2023-01-09: qty 50

## 2023-01-09 MED ORDER — CLINDAMYCIN HCL 300 MG CAPSULE
300.0000 mg | ORAL_CAPSULE | Freq: Three times a day (TID) | ORAL | 0 refills | Status: AC
Start: 2023-01-09 — End: 2023-01-19

## 2023-01-09 MED ORDER — TRAMADOL 37.5 MG-ACETAMINOPHEN 325 MG TABLET
1.0000 | ORAL_TABLET | Freq: Four times a day (QID) | ORAL | 0 refills | Status: DC | PRN
Start: 2023-01-09 — End: 2024-01-15

## 2023-01-09 MED ORDER — SILVER SULFADIAZINE 1 % TOPICAL CREAM
TOPICAL_CREAM | Freq: Every day | CUTANEOUS | Status: DC
Start: 2023-01-09 — End: 2023-01-09

## 2023-01-09 MED ORDER — LIDOCAINE HCL 10 MG/ML (1 %) INJECTION SOLUTION
1.0000 g | Freq: Once | INTRAMUSCULAR | Status: AC
Start: 2023-01-09 — End: 2023-01-09
  Administered 2023-01-09: 1 g via INTRAMUSCULAR

## 2023-01-09 MED ORDER — LIDOCAINE HCL 10 MG/ML (1 %) INJECTION SOLUTION
INTRAMUSCULAR | Status: AC
Start: 2023-01-09 — End: 2023-01-09
  Filled 2023-01-09: qty 20

## 2023-01-09 MED ORDER — CEFTRIAXONE 1 GRAM SOLUTION FOR INJECTION
INTRAMUSCULAR | Status: AC
Start: 2023-01-09 — End: 2023-01-09
  Filled 2023-01-09: qty 10

## 2023-01-09 NOTE — Discharge Instructions (Signed)
Clean the silvadene off completely before reapplying twice a day for 2 to 3 days, then switch to bacitracin. Return for any concern

## 2023-01-09 NOTE — ED Nurses Note (Signed)
Patient discharged home with family/self with treatment of initial complaint upon arrival to ER. AVS reviewed by physician with patient/care giver. A written copy of the AVS and discharge instructions was given to the patient/care giver. Scripts handed to patient/care giver or sent to pharmacy indicated on AVS print out. Pt verbalized understanding of taking any medications as prescribed. Questions and concerns sufficiently answered as needed. Patient/care giver encouraged to follow up with PCP as indicated. In the event of an emergency, patient/care giver instructed to call 911 or go to the nearest emergency room. No other concerns voiced at time of discharge to physician or nurse. Respiration even and unlabored. Pt ambulatory out of ED.
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
## 2023-01-12 ENCOUNTER — Telehealth (INDEPENDENT_AMBULATORY_CARE_PROVIDER_SITE_OTHER): Payer: Self-pay | Admitting: Registered Nurse

## 2023-01-12 NOTE — Telephone Encounter (Signed)
Called patient to remind her of her appt on 01/21/23 at 02:30 pm and to also have labs drawn 2 days prior to this appt.

## 2023-01-16 ENCOUNTER — Other Ambulatory Visit: Payer: Self-pay

## 2023-01-16 ENCOUNTER — Ambulatory Visit: Payer: 59 | Attending: NURSE PRACTITIONER

## 2023-01-16 DIAGNOSIS — R634 Abnormal weight loss: Secondary | ICD-10-CM | POA: Insufficient documentation

## 2023-01-16 DIAGNOSIS — D72819 Decreased white blood cell count, unspecified: Secondary | ICD-10-CM

## 2023-01-16 DIAGNOSIS — D72810 Lymphocytopenia: Secondary | ICD-10-CM

## 2023-01-16 LAB — GIEMSA STAIN (CBC W/DIFF), WITH PATHOLOGIST REVIEW
BASOPHIL #: 0 10*3/uL (ref 0.00–0.10)
BASOPHIL %: 1 % (ref 0–1)
EOSINOPHIL #: 0.1 10*3/uL (ref 0.00–0.50)
EOSINOPHIL %: 2 %
HCT: 36.2 % (ref 31.2–41.9)
HGB: 12.4 g/dL (ref 10.9–14.3)
LYMPHOCYTE #: 0.6 10*3/uL — ABNORMAL LOW (ref 1.00–3.00)
LYMPHOCYTE %: 17 % (ref 16–44)
MCH: 29.8 pg (ref 24.7–32.8)
MCHC: 34.2 g/dL (ref 32.3–35.6)
MCV: 87 fL (ref 75.5–95.3)
MONOCYTE #: 0.4 10*3/uL (ref 0.30–1.00)
MONOCYTE %: 12 % (ref 5–13)
MPV: 8.6 fL (ref 7.9–10.8)
NEUTROPHIL #: 2.4 10*3/uL (ref 1.85–7.80)
NEUTROPHIL %: 68 % (ref 43–77)
PLATELETS: 246 10*3/uL (ref 140–440)
RBC: 4.16 10*6/uL (ref 3.63–4.92)
RDW: 12.8 % (ref 12.3–17.7)
WBC: 3.6 10*3/uL — ABNORMAL LOW (ref 3.8–11.8)
WBCS UNCORRECTED: 3.6 10*3/uL

## 2023-01-16 LAB — FERRITIN: FERRITIN: 46 ng/mL (ref 11–336)

## 2023-01-16 LAB — IRON TRANSFERRIN AND TIBC
IRON (TRANSFERRIN) SATURATION: 23 % (ref 15–50)
IRON: 73 ug/dL (ref 50–212)
TOTAL IRON BINDING CAPACITY: 316 ug/dL (ref 250–450)
TRANSFERRIN: 226 mg/dL (ref 203–362)
UIBC: 243 ug/dL (ref 130–375)

## 2023-01-16 LAB — THYROID STIMULATING HORMONE WITH FREE T4 REFLEX: TSH: 1.156 u[IU]/mL (ref 0.450–5.330)

## 2023-01-19 ENCOUNTER — Inpatient Hospital Stay
Admission: RE | Admit: 2023-01-19 | Discharge: 2023-01-19 | Disposition: A | Discharge status: Home / Self Care | Payer: 59 | Source: Ambulatory Visit | Attending: Surgery | Admitting: Surgery

## 2023-01-19 ENCOUNTER — Other Ambulatory Visit: Payer: Self-pay

## 2023-01-19 DIAGNOSIS — M7918 Myalgia, other site: Secondary | ICD-10-CM | POA: Insufficient documentation

## 2023-01-19 LAB — PATH COMMENT (GIEMSA STAIN): PATHOLOGIST INTERPRETATION: ABNORMAL — AB

## 2023-01-19 MED ORDER — IOHEXOL 350 MG IODINE/ML INTRAVENOUS SOLUTION
100.0000 mL | INTRAVENOUS | Status: AC
Start: 2023-01-19 — End: 2023-01-19
  Administered 2023-01-19: 75 mL via INTRAVENOUS

## 2023-01-21 ENCOUNTER — Ambulatory Visit (INDEPENDENT_AMBULATORY_CARE_PROVIDER_SITE_OTHER): Payer: Self-pay | Admitting: NURSE PRACTITIONER

## 2023-01-21 DIAGNOSIS — D72819 Decreased white blood cell count, unspecified: Secondary | ICD-10-CM

## 2023-01-27 ENCOUNTER — Ambulatory Visit (INDEPENDENT_AMBULATORY_CARE_PROVIDER_SITE_OTHER): Payer: 59 | Admitting: Surgery

## 2023-01-27 ENCOUNTER — Other Ambulatory Visit: Payer: Self-pay

## 2023-01-27 ENCOUNTER — Encounter (INDEPENDENT_AMBULATORY_CARE_PROVIDER_SITE_OTHER): Payer: Self-pay | Admitting: Surgery

## 2023-01-27 VITALS — BP 109/61 | HR 78 | Temp 97.4°F | Resp 18 | Ht 60.0 in | Wt 113.0 lb

## 2023-01-27 DIAGNOSIS — Z9889 Other specified postprocedural states: Secondary | ICD-10-CM

## 2023-01-27 DIAGNOSIS — G8929 Other chronic pain: Secondary | ICD-10-CM

## 2023-01-27 DIAGNOSIS — M7918 Myalgia, other site: Secondary | ICD-10-CM

## 2023-01-27 NOTE — Progress Notes (Signed)
GENERAL SURGERY, [facility] [address]  [place] [address]    Progress Note    Name: Joanne Yoder. Joanne Yoder MRN:  [id number]   Date: 01/27/2023 DOB:  July 10, 1966 (57 y.o.)                Date of Service:  01/27/2023  Joanne Quails., 57 y.o. female  Date of Birth:  12-18-1965  PCP: Joanne Leriche, PA  Referring:  Joanne Yoder     HPI:  Joanne Yoder is a 57 y.o. White female who returns after undergoing CT scan of the pelvis which showed soft tissue calcifications posterior to the gluteal musculature.  The patient has pain and discomfort because of prior surgeries to remove significant calcified nodules in both gluteal regions.  In doing so, she was lost quite a bit of soft tissue in that area such that when she sits she has a lot discomfort chronically.      Past Medical History:   Diagnosis Date    Anxiety     Asthma     Depression     Multiple sclerosis (CMS HCC)     RLS (restless legs syndrome)       Past Surgical History:   Procedure Laterality Date    BUTTOCK MASS EXCISION      FINGER SURGERY      HX CESAREAN SECTION        No outpatient medications have been marked as taking for the 01/27/23 encounter (Office Visit) with Joanne Riches B, MD.      No Known Allergies        BP 109/61   Pulse 78   Temp 36.3 C (97.4 F)   Resp 18   Ht 1.524 m (5')   Wt 51.3 kg (113 lb)   SpO2 100%   BMI 22.07 kg/m          General: appropriate for age. in no acute distress.    Vital signs are present above and have been reviewed by me     HEENT: Atraumatic, Normocephalic.    Lungs: Nonlabored breathing with symmetric expansion    Heart:Regular wth respect to rate and rythmn.    Abdomen:Soft. Nontender. Nondistended and benign    Examination of the gluteal area shows soft tissue volume loss secondary to extensive gluteal surgery in the past    Psychiatric: Alert and oriented to person, place, and time. affect appropriate       Assessment/Plan:  Assessment/Plan   1. Chronic gluteal  pain         Because of patient's extensive gluteal surgery performed bilaterally in the past, she was lost quite a bit of soft tissue padding in that region which when sitting on that area causes quite a bit of chronic pain.  I recommended the patient that she may consider going to see a plastic reconstructive surgeon consider surgery to provide more volume to this area such as gluteal implants.  I told the patient that this has not way a cosmetic operation but more of a functional abrasion since she can not sit on that area.  She understood and will have her family physician refer her to a Psychiatric nurse.  I gave her suggestions such as [facility] or [employer].  The patient was very appreciative for the evaluation.    Return if symptoms worsen or fail to improve.     This note was partially created using voice recognition  software and is inherently subject to errors including those of syntax and "sound alike " substitutions which may escape proof reading. In such instances, original meaning may be extrapolated by contextual derivation.    Jorge Retz Yoder Jerita Wimbush, MD,MBA,FACS

## 2023-02-24 ENCOUNTER — Other Ambulatory Visit (HOSPITAL_COMMUNITY): Payer: Self-pay | Admitting: PHYSICIAN ASSISTANT

## 2023-02-24 DIAGNOSIS — Z1231 Encounter for screening mammogram for malignant neoplasm of breast: Secondary | ICD-10-CM

## 2023-02-26 NOTE — Cancer Center Note (Signed)
HEMATOLOGY/ONCOLOGY, [facility],   [facility]  [address]  [place], [place] [address]       Return patient visit     Name: Joanne Yoder MRN:  [id number]   Date: 02/27/2023 Age: 57 y.o.  DOB: [birth year]       Referring Physician: No ref. provider found  Primary Care Provider: Carmell Austria, PA    Reason for visit/consultation:   Follow Up (Leukopenia, unspecified type)        History of Present Illness:  The patient is a 57 year old female returns  today for follow up on  Leukopenia. Commodities to include GERD, Multiple Sclerosis, Anxiety, Depression, insomnia, Asthma and allergies which is being treated per Dr. Sherilyn Banker. Her Multiple Sclerosis is being managed by Dr. Andee Poles.  She reports having chemotherapy for Multiple Sclerosis in the past but unsure of what that was called.   The patient reports she is doing well no complaints of infection or B symptoms.     Past Medical History  Past Medical History:   Diagnosis Date    Anxiety     Asthma     Depression     Multiple sclerosis (CMS HCC)     RLS (restless legs syndrome)       Past Surgical History:   Procedure Laterality Date    BUTTOCK MASS EXCISION      FINGER SURGERY      HX CESAREAN SECTION        Family Medical History:       Problem Relation (Age of Onset)    No Known Problems Mother, Father            Social History  Occupation:   Social History     Occupational History    Not on file    Hometown: [place] [address]  Social History     Socioeconomic History    Marital status: Married   Tobacco Use    Smoking status: Never    Smokeless tobacco: Never   Vaping Use    Vaping status: Never Used   Substance and Sexual Activity    Alcohol use: Never    Drug use: Never       No Known Allergies  Current Outpatient Medications   Medication Sig    diroximel fumarate (VUMERITY) 231 mg Oral Capsule, Delayed Release(E.C.) Take 2 Capsules (462 mg total) by mouth    famotidine (PEPCID) 40 mg Oral Tablet     FLUoxetine  (PROZAC) 20 mg Oral Capsule Take 1 Capsule (20 mg total) by mouth Once a day    Ibuprofen (MOTRIN) 600 mg Oral Tablet Take 1 Tablet (600 mg total) by mouth Twice daily    metoprolol succinate (TOPROL-XL) 50 mg Oral Tablet Sustained Release 24 hr Take 1 Tablet (50 mg total) by mouth    omeprazole (PRILOSEC) 40 mg Oral Capsule, Delayed Release(E.C.) Take 1 Capsule (40 mg total) by mouth    pregabalin (LYRICA) 75 mg Oral Capsule Take 1 Capsule (75 mg total) by mouth Three times a day    traMADol-acetaminophen (ULTRACET) 37.5-325 mg Oral Tablet Take 1 Tablet by mouth Every 6 hours as needed    zolpidem (AMBIEN) 10 mg Oral Tablet            ROS:   Review of Systems   Constitutional:  Positive for fatigue. Negative for appetite change and unexpected weight change (over the last 3years aproximtely 15lbs).   HENT:  Negative.  Eyes: Negative.    Respiratory:  Negative for cough and shortness of breath.    Cardiovascular:  Negative for chest pain and palpitations.   Gastrointestinal:  Negative for abdominal pain, blood in stool, constipation, diarrhea, nausea and vomiting.   Endocrine: Positive for hot flashes.   Genitourinary:  Negative for difficulty urinating.    Musculoskeletal:  Positive for arthralgias.        Chronic bone pain    Neurological:  Positive for dizziness and light-headedness. Negative for headaches.   Hematological:  Negative for adenopathy. Bruises/bleeds easily.   Psychiatric/Behavioral:  Positive for depression (controlled on medication). Negative for sleep disturbance (controlled on medication) and suicidal ideas. The patient is nervous/anxious (controlled on medication).        Physical Examination:  BP (!) 121/54 (Site: Left, Patient Position: Sitting, Cuff Size: Adult)   Pulse 55   Temp 36.6 C (97.9 F) (Thermal Scan)   Ht 1.524 m (5')   Wt 53.6 kg (118 lb 1.6 oz)   SpO2 100%   BMI 23.06 kg/m       Physical Exam  Vitals reviewed.   Constitutional:       General: She is not in acute  distress.  HENT:      Head: Normocephalic.      Nose: Nose normal.      Mouth/Throat:      Mouth: Mucous membranes are moist.   Eyes:      General: No scleral icterus.  Cardiovascular:      Rate and Rhythm: Normal rate and regular rhythm.      Pulses: Normal pulses.      Heart sounds: Normal heart sounds. No murmur heard.     No friction rub.   Pulmonary:      Effort: Pulmonary effort is normal.      Breath sounds: Normal breath sounds. No wheezing or rhonchi.   Abdominal:      General: Abdomen is flat. Bowel sounds are normal.      Palpations: Abdomen is soft. There is no mass.      Tenderness: There is no abdominal tenderness.      Hernia: No hernia is present.   Musculoskeletal:         General: Normal range of motion.      Cervical back: Normal range of motion.      Right lower leg: No edema.      Left lower leg: No edema.   Skin:     General: Skin is warm and dry.      Capillary Refill: Capillary refill takes less than 2 seconds.      Findings: No bruising or erythema.   Neurological:      General: No focal deficit present.      Mental Status: She is alert and oriented to person, place, and time. Mental status is at baseline.   Psychiatric:         Mood and Affect: Mood normal.         Behavior: Behavior normal.         Thought Content: Thought content normal.         Judgment: Judgment normal.        ECOG Status: 0 - Fully active, able to carry on all pre-disease performance without restriction.       LABS:   CBC  Diff   Lab Results   Component Value Date/Time    WBC 3.6 (L) 01/16/2023 03:39 PM    HGB  12.4 01/16/2023 03:39 PM    HCT 36.2 01/16/2023 03:39 PM    PLTCNT 246 01/16/2023 03:39 PM    RBC 4.16 01/16/2023 03:39 PM    MCV 87.0 01/16/2023 03:39 PM    MCHC 34.2 01/16/2023 03:39 PM    MCH 29.8 01/16/2023 03:39 PM    RDW 12.8 01/16/2023 03:39 PM    MPV 8.6 01/16/2023 03:39 PM    Lab Results   Component Value Date/Time    PMNS 68 01/16/2023 03:39 PM    LYMPHOCYTES 17 01/16/2023 03:39 PM    EOSINOPHIL 2  01/16/2023 03:39 PM    MONOCYTES 12 01/16/2023 03:39 PM    BASOPHILS 1 01/16/2023 03:39 PM    BASOPHILS 0.00 01/16/2023 03:39 PM    PMNABS 2.40 01/16/2023 03:39 PM    LYMPHSABS 0.60 (L) 01/16/2023 03:39 PM    EOSABS 0.10 01/16/2023 03:39 PM    MONOSABS 0.40 01/16/2023 03:39 PM        IRON   Date Value Ref Range Status   01/16/2023 73 50 - 212 ug/dL Final     FERRITIN   Date Value Ref Range Status   01/16/2023 46 11 - 336 ng/mL Final     TOTAL IRON BINDING CAPACITY   Date Value Ref Range Status   01/16/2023 316 250 - 450 ug/dL Final     UIBC   Date Value Ref Range Status   01/16/2023 243 130 - 375 ug/dL Final     IRON (TRANSFERRIN) SATURATION   Date Value Ref Range Status   01/16/2023 23 15 - 50 % Final      01/16/2023 TSH 1.56,   [place] Stain   Leukopenia with absolute lymphopenia.  No white blood cell immaturity is seen.  No blasts are identified.  No dysplastic changes are noted.  Platelets appear adequate without clumping or satellitosis.  Red blood cell morphology is essentially normal.  No schistocytes are seen.  Elijio Miles MD.      Specimen Collected: 01/16/23 15:39        11/27/2022 WBC 2.7, RBC 4.14, Hgb 12.5, Hct 36.1, MCV 87, Plt's 287,000, ANC 1.7, Lymphocytes 0.6, AST 17, ALT 9, B12 373    08/27/2022 WBC 3.0 , RBC 4.06, Hgb 12.2, Hct 36.5, MCV 90, Plt's 258,000, ANC 2.1, Lymphocytes 0.4, AST 14  ALT 7    Pathology:  None to review     Radiology:    01/19/2023 CT Pelvis   Bladder:  Unremarkable.   Uterus and Adnexa:  Unremarkable.   Bowel:   Unremarkable.   Appendix:  Normal.   Lymph nodes:  No suspicious lymph node enlargement.   Vasculature:   Major vascular structures are unremarkable.    Peritoneum / Retroperitoneum: No ascites.  No free air.   Bones:   Unremarkable.   There are soft tissue calcifications in the subcutaneous fat adjacent to the gluteus maximus musculature bilaterally. There are no fluid collections or masses. The gluteal musculature is symmetric and without atrophy.    IMPRESSION:  SOFT TISSUE CALCIFICATIONS POSTERIOR TO THE GLUTEAL MUSCULATURE.        CXR 02/13/2022 The lungs are clear. No pleural effusion or pneumothorax.The heart size and mediastinal contours are normal. No acute osseous abnormality.     Assessment/Plan:     Problem List Items Addressed This Visit          Hematologic/Lymphatic    Leukopenia - Primary    Relevant Orders    CBC/DIFF    CBC/DIFF  Oncology History    No history exists.        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  Weight loss improved with gain of a few pounds and normal thyroid function.  No indication for iron deficiency iron labs are normal  Patient will return in 3 months with CBC prior external order provided     The patient was given the opportunity to ask questions, and these were answered to their satisfaction. Kara Mead  is welcome to call with any questions or concerns at any point.             The patient was given the opportunity to ask questions, and these were answered to their satisfaction. RACHAL DVORSKY  is welcome to call with any questions or concerns at any point.        Return in about 3 months (around 05/29/2023).    On the day of the encounter, I spent a total of  32  minutes on this patient encounter including review of historical information, examination, documentation and post-visit activities.         Evie Lacks APRN, FNP-C 4/19/202408:30    You can see your note(s) in [facility]. It is common for you to encounter certain medical terminology which may be unfamiliar to you. You might see results before your provider does so please give at least 2 business days for review. Please have this understanding, that NOT all abnormal results are significant. Our office will contact you for any urgent  or emergent action if necessary. If you have any questions or concerns, feel free to send a MyChart message or call the office. Please call with any new or concerning symptoms.       CC:  Jamie Carosi, PA  [address]  [place] [address]    No primary care provider on file.    Portions of this note may be dictated using voice recognition software or a dictation service. Variances in spelling and vocabulary are possible and unintentional. Not all errors are caught/corrected. Please notify the [facility]in if any discrepancies are noted or if the meaning of any statement is not clear.

## 2023-02-27 ENCOUNTER — Other Ambulatory Visit: Payer: Self-pay

## 2023-02-27 ENCOUNTER — Ambulatory Visit: Payer: 59 | Attending: NURSE PRACTITIONER | Admitting: NURSE PRACTITIONER

## 2023-02-27 ENCOUNTER — Encounter (INDEPENDENT_AMBULATORY_CARE_PROVIDER_SITE_OTHER): Payer: Self-pay | Admitting: NURSE PRACTITIONER

## 2023-02-27 VITALS — BP 121/54 | HR 55 | Temp 97.9°F | Ht 60.0 in | Wt 118.1 lb

## 2023-02-27 DIAGNOSIS — D72829 Elevated white blood cell count, unspecified: Secondary | ICD-10-CM | POA: Insufficient documentation

## 2023-02-27 DIAGNOSIS — Z79899 Other long term (current) drug therapy: Secondary | ICD-10-CM | POA: Insufficient documentation

## 2023-02-27 DIAGNOSIS — G35 Multiple sclerosis: Secondary | ICD-10-CM | POA: Insufficient documentation

## 2023-02-27 DIAGNOSIS — D72810 Lymphocytopenia: Secondary | ICD-10-CM | POA: Insufficient documentation

## 2023-02-27 DIAGNOSIS — R634 Abnormal weight loss: Secondary | ICD-10-CM | POA: Insufficient documentation

## 2023-02-27 DIAGNOSIS — D72819 Decreased white blood cell count, unspecified: Secondary | ICD-10-CM

## 2023-02-27 DIAGNOSIS — Z6823 Body mass index (BMI) 23.0-23.9, adult: Secondary | ICD-10-CM | POA: Insufficient documentation

## 2023-02-27 NOTE — Patient Instructions (Signed)
Your next appointment will be located on [address] [facility] of [facility] 3rd floor     You will need to have your labs drawn 1-2 days before you next appointment, if you would like to discuss them at your next appointment, otherwise you can have your labs drawn at our new office location but we will be unable to discuss all test results during your visit.

## 2023-05-18 ENCOUNTER — Ambulatory Visit (HOSPITAL_COMMUNITY): Payer: Self-pay

## 2023-05-29 ENCOUNTER — Encounter (INDEPENDENT_AMBULATORY_CARE_PROVIDER_SITE_OTHER): Payer: Self-pay | Admitting: NURSE PRACTITIONER

## 2023-05-29 ENCOUNTER — Ambulatory Visit (INDEPENDENT_AMBULATORY_CARE_PROVIDER_SITE_OTHER): Payer: Self-pay | Admitting: NURSE PRACTITIONER

## 2023-05-29 ENCOUNTER — Ambulatory Visit (INDEPENDENT_AMBULATORY_CARE_PROVIDER_SITE_OTHER): Payer: Self-pay

## 2023-06-24 ENCOUNTER — Encounter (HOSPITAL_COMMUNITY): Payer: Self-pay

## 2023-06-24 ENCOUNTER — Other Ambulatory Visit: Payer: Self-pay

## 2023-06-24 ENCOUNTER — Other Ambulatory Visit (HOSPITAL_COMMUNITY): Payer: Self-pay | Admitting: PSYCHIATRY AND NEUROLOGY-NEUROLOGY

## 2023-06-24 ENCOUNTER — Inpatient Hospital Stay (HOSPITAL_COMMUNITY)
Admission: RE | Admit: 2023-06-24 | Discharge: 2023-06-24 | Disposition: A | Discharge status: Home / Self Care | Payer: 59 | Source: Ambulatory Visit | Attending: PSYCHIATRY AND NEUROLOGY-NEUROLOGY | Admitting: PSYCHIATRY AND NEUROLOGY-NEUROLOGY

## 2023-06-24 ENCOUNTER — Inpatient Hospital Stay
Admission: RE | Admit: 2023-06-24 | Discharge: 2023-06-24 | Disposition: A | Discharge status: Home / Self Care | Payer: 59 | Source: Ambulatory Visit | Attending: PSYCHIATRY AND NEUROLOGY-NEUROLOGY | Admitting: PSYCHIATRY AND NEUROLOGY-NEUROLOGY

## 2023-06-24 DIAGNOSIS — G35 Multiple sclerosis: Secondary | ICD-10-CM | POA: Insufficient documentation

## 2023-06-25 ENCOUNTER — Ambulatory Visit
Admission: RE | Admit: 2023-06-25 | Discharge: 2023-06-25 | Disposition: A | Discharge status: Home / Self Care | Payer: 59 | Source: Ambulatory Visit | Attending: PSYCHIATRY AND NEUROLOGY-NEUROLOGY | Admitting: PSYCHIATRY AND NEUROLOGY-NEUROLOGY

## 2023-06-25 ENCOUNTER — Encounter (HOSPITAL_COMMUNITY): Payer: Self-pay

## 2023-06-25 VITALS — BP 107/69 | HR 65 | Temp 97.7°F | Resp 18

## 2023-06-25 DIAGNOSIS — G35 Multiple sclerosis: Secondary | ICD-10-CM | POA: Insufficient documentation

## 2023-06-25 MED ORDER — SODIUM CHLORIDE 0.9 % INTRAVENOUS SOLUTION
1000.0000 mg | Freq: Once | INTRAVENOUS | Status: AC
Start: 2023-06-25 — End: 2023-06-25
  Administered 2023-06-25: 0 mg via INTRAVENOUS
  Administered 2023-06-25: 1000 mg via INTRAVENOUS
  Filled 2023-06-25: qty 16

## 2023-06-25 NOTE — Nurses Notes (Signed)
1353: Patient arrived ambulatory with husband. VSS. Assessments WNL. No complaints voiced.Evans Lance, RN  1400: IV access established in LFA.Evans Lance, RN  [phone]: The first of 3 doses 1000 mg Solumedrol infused over 2hrs per pharmacy. Evans Lance, RN  [phone]: Infusion and flush complete. VSS. IV site wrapped as patient will return for the next 2 days.Evans Lance, RN  [phone]: Patient left unit ambulatory with husband in no new reported distress. Evans Lance, RN

## 2023-06-26 ENCOUNTER — Ambulatory Visit
Admission: RE | Admit: 2023-06-26 | Discharge: 2023-06-26 | Disposition: A | Discharge status: Home / Self Care | Payer: 59 | Source: Ambulatory Visit | Attending: PSYCHIATRY AND NEUROLOGY-NEUROLOGY | Admitting: PSYCHIATRY AND NEUROLOGY-NEUROLOGY

## 2023-06-26 ENCOUNTER — Other Ambulatory Visit: Payer: Self-pay

## 2023-06-26 VITALS — BP 123/69 | HR 70 | Temp 99.3°F | Resp 18

## 2023-06-26 DIAGNOSIS — G35 Multiple sclerosis: Secondary | ICD-10-CM | POA: Insufficient documentation

## 2023-06-26 MED ORDER — METHYLPREDNISOLONE SODIUM SUCCINATE 1,000 MG INTRAVENOUS SOLUTION
1000.0000 mg | Freq: Once | INTRAVENOUS | Status: AC
Start: 2023-06-26 — End: 2023-06-26
  Administered 2023-06-26: 1000 mg via INTRAVENOUS
  Administered 2023-06-26: 0 mg via INTRAVENOUS
  Filled 2023-06-26: qty 16

## 2023-06-26 NOTE — Nurses Notes (Signed)
1355 Pt ambulatory to OP Onc unit for day 2 of IV Solu-Medrol. VS obtained. Physical assessment complete and WNL. 22 G catheter placed L lat FA on 06/25/23 flushed easily but no blood return noted. Pt denies c/o pain, burning, swelling or redness. Felecia Jan, RN  [phone] Solu-Medrol 1,000 mg IV infusion started to infuse over 2 hrs. Felecia Jan, RN  [phone] Husband at side. No c/o voiced. Pt tolerating infusion without difficulty. Felecia Jan, RN  [phone] Solu-Medrol infusion completed. Line flushed with NS. Pt tolerated tx without difficulty. Felecia Jan, RN  [phone] IV secured with Stretch Net wrap. Pt to return to 4 E on 06/27/23    2 pm. for day 3 of Solu-Medrol. Pt instructed where to go and what time. Pt verbalized understanding. Felecia Jan, RN  [phone] Pt left OP Onc unit ambulatory with husband to assist. No adverse reaction noted. Felecia Jan, RN

## 2023-06-27 ENCOUNTER — Ambulatory Visit
Admission: RE | Admit: 2023-06-27 | Discharge: 2023-06-27 | Disposition: A | Discharge status: Home / Self Care | Payer: 59 | Source: Ambulatory Visit | Attending: PSYCHIATRY AND NEUROLOGY-NEUROLOGY | Admitting: PSYCHIATRY AND NEUROLOGY-NEUROLOGY

## 2023-06-27 ENCOUNTER — Encounter (HOSPITAL_COMMUNITY): Payer: Self-pay

## 2023-06-27 ENCOUNTER — Other Ambulatory Visit (HOSPITAL_COMMUNITY): Payer: Self-pay | Admitting: PHYSICIAN ASSISTANT

## 2023-06-27 VITALS — BP 102/48 | HR 51 | Temp 97.9°F | Resp 18

## 2023-06-27 DIAGNOSIS — G35 Multiple sclerosis: Secondary | ICD-10-CM

## 2023-06-27 MED ORDER — SODIUM CHLORIDE 0.9 % INTRAVENOUS SOLUTION
1000.0000 mg | Freq: Once | INTRAVENOUS | Status: AC
Start: 2023-06-27 — End: 2023-06-27
  Administered 2023-06-27: 1000 mg via INTRAVENOUS
  Administered 2023-06-27: 0 mg via INTRAVENOUS
  Filled 2023-06-27: qty 16

## 2023-06-27 NOTE — Nurses Notes (Signed)
Patient here for her solumedrol infusion ambulatory.  Patient denied any new issues but after checking her BP asked patient if she was having any problems with being dizzy when standing and she stated some.  Told her she was getting an infusion of and should build her up and will check her again before I let her go.

## 2023-06-29 NOTE — Addendum Note (Signed)
Encounter addended by: Sharlyne Pacas, RN on: 06/29/2023 7:11 AM   Actions taken: MAR administration accepted, Flowsheet accepted

## 2023-07-03 ENCOUNTER — Emergency Department (HOSPITAL_BASED_OUTPATIENT_CLINIC_OR_DEPARTMENT_OTHER): Payer: No Typology Code available for payment source

## 2023-07-03 ENCOUNTER — Emergency Department
Admission: EM | Admit: 2023-07-03 | Discharge: 2023-07-03 | Disposition: A | Discharge status: Home / Self Care | Payer: No Typology Code available for payment source | Attending: EMERGENCY MEDICINE | Admitting: EMERGENCY MEDICINE

## 2023-07-03 ENCOUNTER — Encounter (HOSPITAL_COMMUNITY): Payer: Self-pay

## 2023-07-03 ENCOUNTER — Other Ambulatory Visit: Payer: Self-pay

## 2023-07-03 ENCOUNTER — Encounter (HOSPITAL_BASED_OUTPATIENT_CLINIC_OR_DEPARTMENT_OTHER): Payer: Self-pay

## 2023-07-03 DIAGNOSIS — Y990 Civilian activity done for income or pay: Secondary | ICD-10-CM | POA: Insufficient documentation

## 2023-07-03 DIAGNOSIS — M545 Low back pain, unspecified: Secondary | ICD-10-CM

## 2023-07-03 DIAGNOSIS — S39012A Strain of muscle, fascia and tendon of lower back, initial encounter: Secondary | ICD-10-CM

## 2023-07-03 DIAGNOSIS — X500XXA Overexertion from strenuous movement or load, initial encounter: Secondary | ICD-10-CM | POA: Insufficient documentation

## 2023-07-03 DIAGNOSIS — Y92219 Unspecified school as the place of occurrence of the external cause: Secondary | ICD-10-CM | POA: Insufficient documentation

## 2023-07-03 MED ORDER — METHOCARBAMOL 500 MG TABLET
500.0000 mg | ORAL_TABLET | ORAL | Status: AC
Start: 2023-07-03 — End: 2023-07-03
  Administered 2023-07-03: 500 mg via ORAL

## 2023-07-03 MED ORDER — NAPROXEN 500 MG TABLET
500.0000 mg | ORAL_TABLET | Freq: Two times a day (BID) | ORAL | 0 refills | Status: DC
Start: 2023-07-03 — End: 2024-01-15

## 2023-07-03 MED ORDER — PREDNISONE 20 MG TABLET
20.0000 mg | ORAL_TABLET | Freq: Two times a day (BID) | ORAL | 0 refills | Status: AC
Start: 2023-07-03 — End: 2023-07-08

## 2023-07-03 MED ORDER — LIDOCAINE 4 % TOPICAL PATCH
1.0000 | MEDICATED_PATCH | Freq: Every day | CUTANEOUS | 0 refills | Status: AC
Start: 2023-07-03 — End: 2023-07-10

## 2023-07-03 MED ORDER — KETOROLAC 30 MG/ML (1 ML) INJECTION SOLUTION
30.0000 mg | INTRAMUSCULAR | Status: AC
Start: 2023-07-03 — End: 2023-07-03
  Administered 2023-07-03: 30 mg via INTRAMUSCULAR

## 2023-07-03 MED ORDER — METHOCARBAMOL 500 MG TABLET
500.0000 mg | ORAL_TABLET | Freq: Three times a day (TID) | ORAL | 0 refills | Status: AC
Start: 2023-07-03 — End: 2023-07-10

## 2023-07-03 MED ORDER — DEXAMETHASONE SODIUM PHOSPHATE (PF) 10 MG/ML INJECTION SOLUTION
10.0000 mg | INTRAMUSCULAR | Status: AC
Start: 2023-07-03 — End: 2023-07-03
  Administered 2023-07-03: 10 mg via INTRAMUSCULAR

## 2023-07-03 MED ORDER — METHOCARBAMOL 500 MG TABLET
ORAL_TABLET | ORAL | Status: AC
Start: 2023-07-03 — End: 2023-07-03
  Filled 2023-07-03: qty 1

## 2023-07-03 MED ORDER — DEXAMETHASONE SODIUM PHOSPHATE (PF) 10 MG/ML INJECTION SOLUTION
INTRAMUSCULAR | Status: AC
Start: 2023-07-03 — End: 2023-07-03
  Filled 2023-07-03: qty 1

## 2023-07-03 MED ORDER — KETOROLAC 30 MG/ML (1 ML) INJECTION SOLUTION
INTRAMUSCULAR | Status: AC
Start: 2023-07-03 — End: 2023-07-03
  Filled 2023-07-03: qty 1

## 2023-07-03 NOTE — ED Triage Notes (Signed)
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 States it is difficult to straighten up.

## 2023-07-03 NOTE — ED Provider Notes (Signed)
[facility]  Emergency Department  Advanced Practice Provider Note      CHIEF COMPLAINT  Chief Complaint   Patient presents with    Back Pain     HISTORY OF PRESENT ILLNESS  Joanne Yoder, date of birth 04-Sep-1966, is a 57 y.o. female who presented to the Emergency Department.    Patient is a 57 year old female who presents to the ED with complaint of low back pain.  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  Patient is complaining of low back pain, mostly to the right side.  Patient states "it is locked up." Patient denies that the pain radiates.  Denies any numbness, tingling or weakness to the lower extremities.  Denies any saddle anesthesia.  Denies any urinary or bowel incontinence or retention.  Reports taking an ibuprofen earlier today around 10:00 a.m.Marland Kitchen  Patient denies any previous back injury or back surgery.  Describes pain as a dull ache with intermittent sharp.  Patient rates pain a 10/10.      PAST MEDICAL/SURGICAL/FAMILY/SOCIAL HISTORY  Past Medical History:   Diagnosis Date    Anxiety     Asthma     Depression     Multiple sclerosis (CMS HCC)     RLS (restless legs syndrome)        Past Surgical History:   Procedure Laterality Date    BUTTOCK MASS EXCISION      FINGER SURGERY      HX CESAREAN SECTION         Family Medical History:       Problem Relation (Age of Onset)    No Known Problems Mother, Father          Social History     Socioeconomic History    Marital status: Married   Tobacco Use    Smoking status: Never    Smokeless tobacco: Never   Vaping Use    Vaping status: Never Used   Substance and Sexual Activity    Alcohol use: Never    Drug use: Never      ALLERGIES  No Known Allergies        PHYSICAL EXAM  VITAL SIGNS:  Filed Vitals:    07/03/23 1253   BP: (!) 97/52   Pulse: 66   Resp: 18   Temp: 36.1 C (97 F)   SpO2: 98%     Constitutional: Awake. Well appearing. Average body weight. No distress  noted.   Cardiovascular: Regular rate. S1, S2 with no murmur or gallop heard. No swelling to extremities  Pulmonary/Chest: Breath sounds clear and equal bilaterally. No wheezes, rales or chest tenderness. No respiratory distress.            Musculoskeletal: No tenderness or deformity. Normal muscle tone and strength. Tenderness to R paraspinal muscle, lumbar  Skin: warm and dry. No rash, redness, or bruising  Psychiatric: normal mood and affect. Behavior is normal.   Neurological: Alert, oriented. Normal gait. No focal weakness noted. No sensory deficit    Nursing notes reviewed.     DIAGNOSTICS  Labs:  Labs listed below were reviewed and interpreted by me.  No results found for any visits on 07/03/23.  Radiology:       ED COURSE/MEDICAL DECISION MAKING  Medications Administered in the ED   ketorolac (TORADOL) 30 mg/mL injection (has no administration in time range)   dexAMETHasone (PF) 10 mg/mL injection (has  no administration in time range)   methocarbamol (ROBAXIN) tablet (has no administration in time range)      ED Course as of 07/03/23 1401   Fri Jul 03, 2023   1401 XR LUMBAR SPINE AP AND LAT  FINDINGS:  Normal lumbar vertebral heights.  No evidence of fracture.  Mild disc space height loss at L5-S1.  Mild levoscoliosis. No spondylolisthesis.        IMPRESSION:  NO ACUTE FINDINGS.        Medical Decision Making  Patient is a 58 year old female who presents to the ED with complaint of low back pain.  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  Patient is complaining of low back pain, mostly to the right side.  Patient states "it is locked up." Patient denies that the pain radiates.  Denies any numbness, tingling or weakness to the lower extremities.  Denies any saddle anesthesia.  Denies any urinary or bowel incontinence or retention.  Reports taking an ibuprofen earlier today around 10:00 a.m.Marland Kitchen  Patient denies any previous back injury or  back surgery.  Describes pain as a dull ache with intermittent sharp.  Patient rates pain a 10/10.    Differential diagnosis includes but is not limited to scoliosis, muscle spasm, compression fracture, spinal stenosis, degenerative disc disease, cauda equina    Patient's x-ray shows no acute findings.  Patient reports she is feeling much better after receiving the medications while in the ED. patient rates her pain has decreased to a 5 and she is able to move better.  Discussed findings and plan of care including discharge with patient.  Patient will be discharged with prescription for prednisone, naproxen and methocarbamol to use for the next several days.  Patient encouraged to limit physical activity but practice daily stretching exercises.  No strenuous activity or heavy lifting.  Patient to follow up with her PCP early next week.  Given strict return to ED precautions.  Patient verbalizes understanding and agrees to this plan of care    Amount and/or Complexity of Data Reviewed  Radiology: ordered. Decision-making details documented in ED Course.    Risk  OTC drugs.  Prescription drug management.      CLINICAL IMPRESSION  Clinical Impression   Low back pain (Primary)   Low back strain, initial encounter     DISPOSITION  Discharged       DISCHARGE MEDICATIONS  Current Discharge Medication List        START taking these medications    Details   lidocaine 4 % Adhesive Patch, Medicated Place 1 Patch on the skin Once a day for 7 days  Qty: 7 Patch, Refills: 0      methocarbamoL (ROBAXIN) 500 mg Oral Tablet Take 1 Tablet (500 mg total) by mouth Three times a day for 7 days  Qty: 21 Tablet, Refills: 0      naproxen (NAPROSYN) 500 mg Oral Tablet Take 1 Tablet (500 mg total) by mouth Twice daily with food  Qty: 30 Tablet, Refills: 0      predniSONE (DELTASONE) 20 mg Oral Tablet Take 1 Tablet (20 mg total) by mouth Twice daily for 5 days  Qty: 10 Tablet, Refills: 0             Sherlie Ban, FNP-C 07/03/2023, 13:42    [facility]  Department of Emergency Medicine  [facility]    This note was  partially generated using MModal Fluency Direct system, and there may be some incorrect words, spellings, and punctuation that were not noted in checking the note before saving.    -----  I was physically present in the treatment area and immediately available for consultation during the period of this patients care. This does not imply that I physically saw and examined the patient or participated in the medical decision making at the time of service. I was not involved in the bedside history, physical, or evaluation.     Pam Drown MD

## 2023-07-03 NOTE — Discharge Instructions (Signed)
Continue to monitor symptoms.  Take medication as prescribed.  Practice daily stretching exercises.  No strenuous activity or heavy lifting.  Follow-up with your family doctor early next week.  Return to ER immediately for any new or worsening symptoms

## 2023-07-09 ENCOUNTER — Encounter (HOSPITAL_COMMUNITY): Payer: Self-pay

## 2024-01-15 ENCOUNTER — Encounter (HOSPITAL_BASED_OUTPATIENT_CLINIC_OR_DEPARTMENT_OTHER): Payer: Self-pay

## 2024-01-15 ENCOUNTER — Emergency Department (HOSPITAL_BASED_OUTPATIENT_CLINIC_OR_DEPARTMENT_OTHER)

## 2024-01-15 ENCOUNTER — Other Ambulatory Visit: Payer: Self-pay

## 2024-01-15 ENCOUNTER — Emergency Department
Admission: EM | Admit: 2024-01-15 | Discharge: 2024-01-15 | Disposition: A | Discharge status: Home / Self Care | Source: Home / Self Care | Attending: Emergency Medicine | Admitting: Emergency Medicine

## 2024-01-15 DIAGNOSIS — N3000 Acute cystitis without hematuria: Secondary | ICD-10-CM | POA: Insufficient documentation

## 2024-01-15 DIAGNOSIS — S39012A Strain of muscle, fascia and tendon of lower back, initial encounter: Secondary | ICD-10-CM | POA: Insufficient documentation

## 2024-01-15 DIAGNOSIS — W19XXXA Unspecified fall, initial encounter: Secondary | ICD-10-CM

## 2024-01-15 DIAGNOSIS — S300XXA Contusion of lower back and pelvis, initial encounter: Secondary | ICD-10-CM | POA: Insufficient documentation

## 2024-01-15 DIAGNOSIS — W109XXA Fall (on) (from) unspecified stairs and steps, initial encounter: Secondary | ICD-10-CM | POA: Insufficient documentation

## 2024-01-15 DIAGNOSIS — M62838 Other muscle spasm: Secondary | ICD-10-CM | POA: Insufficient documentation

## 2024-01-15 LAB — URINALYSIS, MICROSCOPIC

## 2024-01-15 LAB — URINALYSIS, MACRO/MICRO
BILIRUBIN: NEGATIVE mg/dL
BLOOD: NEGATIVE mg/dL
GLUCOSE: NEGATIVE mg/dL
KETONES: NEGATIVE mg/dL
NITRITE: POSITIVE — AB
PH: 7 (ref 4.6–8.0)
SPECIFIC GRAVITY: 1.025 (ref 1.003–1.035)
UROBILINOGEN: 1 mg/dL (ref 0.2–1.0)

## 2024-01-15 MED ORDER — LIDOCAINE HCL 10 MG/ML (1 %) INJECTION SOLUTION
INTRAMUSCULAR | Status: AC
Start: 2024-01-15 — End: 2024-01-15
  Filled 2024-01-15: qty 20

## 2024-01-15 MED ORDER — CEPHALEXIN 500 MG CAPSULE
500.0000 mg | ORAL_CAPSULE | Freq: Four times a day (QID) | ORAL | 0 refills | Status: AC
Start: 2024-01-15 — End: 2024-01-25

## 2024-01-15 MED ORDER — METHYLPREDNISOLONE ACETATE 80 MG/ML SUSPENSION FOR INJECTION
80.0000 mg | Freq: Once | INTRAMUSCULAR | Status: AC
Start: 2024-01-15 — End: 2024-01-15
  Administered 2024-01-15: 80 mg via INTRAMUSCULAR

## 2024-01-15 MED ORDER — METHYLPREDNISOLONE ACETATE 80 MG/ML SUSPENSION FOR INJECTION
INTRAMUSCULAR | Status: AC
Start: 2024-01-15 — End: 2024-01-15
  Filled 2024-01-15: qty 1

## 2024-01-15 MED ORDER — KETOROLAC 60 MG/2 ML INTRAMUSCULAR SOLUTION
INTRAMUSCULAR | Status: AC
Start: 2024-01-15 — End: 2024-01-15
  Filled 2024-01-15: qty 2

## 2024-01-15 MED ORDER — CYCLOBENZAPRINE 10 MG TABLET
10.0000 mg | ORAL_TABLET | ORAL | Status: AC
Start: 2024-01-15 — End: 2024-01-15
  Administered 2024-01-15: 10 mg via ORAL

## 2024-01-15 MED ORDER — KETOROLAC 10 MG TABLET
10.0000 mg | ORAL_TABLET | Freq: Four times a day (QID) | ORAL | 0 refills | Status: AC | PRN
Start: 2024-01-15 — End: ?

## 2024-01-15 MED ORDER — CYCLOBENZAPRINE 10 MG TABLET
ORAL_TABLET | ORAL | Status: AC
Start: 2024-01-15 — End: 2024-01-15
  Filled 2024-01-15: qty 1

## 2024-01-15 MED ORDER — CYCLOBENZAPRINE 10 MG TABLET
10.0000 mg | ORAL_TABLET | Freq: Three times a day (TID) | ORAL | 0 refills | Status: AC | PRN
Start: 2024-01-15 — End: ?

## 2024-01-15 MED ORDER — CEFTRIAXONE 1 GRAM SOLUTION FOR INJECTION
INTRAMUSCULAR | Status: AC
Start: 2024-01-15 — End: 2024-01-15
  Filled 2024-01-15: qty 10

## 2024-01-15 MED ORDER — KETOROLAC 60 MG/2 ML INTRAMUSCULAR SOLUTION
60.0000 mg | INTRAMUSCULAR | Status: AC
Start: 2024-01-15 — End: 2024-01-15
  Administered 2024-01-15: 60 mg via INTRAMUSCULAR

## 2024-01-15 MED ORDER — LIDOCAINE HCL 10 MG/ML (1 %) INJECTION SOLUTION
1.0000 g | Freq: Once | INTRAMUSCULAR | Status: AC
Start: 2024-01-15 — End: 2024-01-15
  Administered 2024-01-15: 1 g via INTRAMUSCULAR

## 2024-01-15 NOTE — ED Nurses Note (Signed)
 Pt c/o lower R side back pain after a fall yesterday. Denies hitting head, denies LOC. Pt states pain is worse upon inspiration. Denies any urinary issues. Respirations even and unlabored w/ no s/s of acute distress noted at this time.

## 2024-01-15 NOTE — ED Provider Notes (Signed)
 [facility] - Emergency Department  ED Primary Provider Note  History of Present Illness   Chief Complaint   Patient presents with    Back Injury     [facility] yesterday and struck right back on flower pot.  Complains of  pain right flank.   Denies dysuria.   Denies SOB.  States sharp pain with deep breath.     Joanne Yoder is a 58 y.o. female who had concerns including Back Injury.  Arrival: The patient arrived by Car complaining falling down a couple stairs and hitting her back on a flower pot yesterday.  Patient states that ever since then she has been having severe back pain to her right CVA area.  She denies any hematuria.  No dysuria or increased frequency.  No fever chills.  Patient denies any incontinence of urine or stool.  She denies any one-sided weakness or numbness tingling.  Patient is unable to move without severe pain to her back.    HPI  Review of Systems   Review of Systems   Constitutional:  Positive for activity change and appetite change. Negative for chills and fever.   HENT:  Negative for ear pain and sore throat.    Eyes:  Negative for pain and visual disturbance.   Respiratory:  Negative for cough and shortness of breath.    Cardiovascular:  Negative for chest pain and palpitations.   Gastrointestinal:  Negative for abdominal pain and vomiting.   Genitourinary:  Negative for dysuria and hematuria.   Musculoskeletal:  Positive for arthralgias, back pain and myalgias.   Skin:  Negative for color change and rash.   Neurological:  Negative for seizures and syncope.   All other systems reviewed and are negative.     Historical Data   History Reviewed This Encounter:     Physical Exam   ED Triage Vitals [01/15/24 1455]   BP (Non-Invasive) (!) 115/51   Heart Rate 63   Respiratory Rate 18   Temperature 36.4 C (97.5 F)   SpO2 97 %   Weight 54.4 kg (120 lb)   Height 1.524 m (5')     Physical Exam  Vitals and nursing note reviewed.   Constitutional:       General: She  is not in acute distress.     Appearance: Normal appearance. She is well-developed and normal weight.   HENT:      Head: Normocephalic and atraumatic.      Right Ear: External ear normal.      Left Ear: External ear normal.      Nose: Nose normal.      Mouth/Throat:      Mouth: Mucous membranes are dry.   Eyes:      Extraocular Movements: Extraocular movements intact.      Conjunctiva/sclera: Conjunctivae normal.      Pupils: Pupils are equal, round, and reactive to light.   Cardiovascular:      Rate and Rhythm: Normal rate and regular rhythm.      Pulses: Normal pulses.      Heart sounds: Normal heart sounds. No murmur heard.  Pulmonary:      Effort: Pulmonary effort is normal. No respiratory distress.      Breath sounds: Normal breath sounds.   Abdominal:      General: Bowel sounds are normal.      Palpations: Abdomen is soft.      Tenderness: There is no abdominal tenderness.   Musculoskeletal:  General: Tenderness present. No swelling.      Cervical back: Normal range of motion and neck supple.      Comments: Positive right L4.  No swelling Or ecchymosis.  No crepitus.   Skin:     General: Skin is warm and dry.      Capillary Refill: Capillary refill takes less than 2 seconds.   Neurological:      General: No focal deficit present.      Mental Status: She is alert and oriented to person, place, and time.   Psychiatric:         Mood and Affect: Mood normal.         Behavior: Behavior normal.         Thought Content: Thought content normal.       Patient Data     Labs Ordered/Reviewed   URINALYSIS, MACRO/MICRO - Abnormal; Notable for the following components:       Result Value    LEUKOCYTES Small (*)     NITRITE Positive (*)     PROTEIN Trace (*)     All other components within normal limits   URINALYSIS, MICROSCOPIC - Abnormal; Notable for the following components:    RBCS 16-20 (*)     BACTERIA Many (*)     WBCS 20-50 (*)     All other components within normal limits   URINE CULTURE,ROUTINE   URINALYSIS  WITH REFLEX MICROSCOPIC AND CULTURE IF POSITIVE    Narrative:     The following orders were created for panel order URINALYSIS WITH REFLEX MICROSCOPIC AND CULTURE IF POSITIVE.  Procedure                               Abnormality         Status                     ---------                               -----------         ------                     URINALYSIS, MACRO/MICRO[[id number]]      Abnormal            Final result               URINALYSIS, MICROSCOPIC[[id number]]      Abnormal            Final result                 Please view results for these tests on the individual orders.     CT LUMBAR SPINE WO IV CONTRAST   Final Result by Edi, Radresults In (03/07 1646)   NO ACUTE LUMBAR FRACTURE.         One or more dose reduction techniques were used (e.g., Automated exposure control, adjustment of the mA and/or kV according to patient size, use of iterative reconstruction technique).         Radiologist location ID: [id number]           Medical Decision Making        Medical Decision Making  Patient is 58 year old white female complaining of falling down 2 steps into a flower pot hitting her right side of her lower  back yesterday.  She has since then she has been having a lot of pain in her lower back nonradiating.  She denies any hematuria.  No numbness or tingling in the extremity.  No incontinence of urine or stool.  She states every time she moves it is unbearable with pain.  Patient denies any numbness or tingling going down the extremity.  No one-sided weakness.  Patient will give [place] a urinalysis as well as an LS spine.  She will be treated for results and then discharged home patient will follow up with her PMD in the next 2-3 days.    Amount and/or Complexity of Data Reviewed  Labs: ordered.  Radiology: ordered.    Risk  Prescription drug management.             Medications Ordered/Administered in the ED   cefTRIAXone (ROCEPHIN) 1 g in lidocaine 2.86 mL (tot vol) IM injection (has no administration in time  range)   ketorolac (TORADOL) 60mg /2 mL IM injection (has no administration in time range)   methylPREDNISolone acetate (DEPO-medrol) 80 mg/mL injection (has no administration in time range)   cyclobenzaprine (FLEXERIL) tablet (has no administration in time range)     Clinical Impression   Acute cystitis without hematuria (Primary)   Lumbosacral strain, initial encounter   Muscle spasm   Fall   Lumbar contusion, initial encounter       Disposition: Discharged               Clinical Impression   Acute cystitis without hematuria (Primary)   Lumbosacral strain, initial encounter   Muscle spasm   Fall   Lumbar contusion, initial encounter       Current Discharge Medication List        START taking these medications    Details   cephalexin (KEFLEX) 500 mg Oral Capsule Take 1 Capsule (500 mg total) by mouth Four times a day for 10 days  Qty: 40 Capsule, Refills: 0      cyclobenzaprine (FLEXERIL) 10 mg Oral Tablet Take 1 Tablet (10 mg total) by mouth Three times a day as needed for Muscle spasms  Qty: 12 Tablet, Refills: 0      ketorolac tromethamine (TORADOL) 10 mg Oral Tablet Take 1 Tablet (10 mg total) by mouth Every 6 hours as needed for Pain  Qty: 20 Tablet, Refills: 0

## 2024-01-15 NOTE — ED Nurses Note (Signed)
 Patient discharged home with family.  AVS reviewed with patient/care giver.  A written copy of the AVS and discharge instructions was given to the patient/care giver. Scripts sent to pharmacy on file. Questions sufficiently answered as needed.  Patient/care giver encouraged to follow up with PCP as indicated.  In the event of an emergency, patient/care giver instructed to call 911 or go to the nearest emergency room. Pt verbalized understanding of discharge instructions. Denies any questions or concerns at this time. Respirations even and unlabored w/ no s/s of acute distress noted at this time. Pt left ED with spouse.

## 2024-01-17 LAB — URINE CULTURE,ROUTINE: URINE CULTURE: >100000 — AB

## 2024-01-23 ENCOUNTER — Ambulatory Visit (HOSPITAL_BASED_OUTPATIENT_CLINIC_OR_DEPARTMENT_OTHER): Payer: Self-pay | Admitting: Nurse Practitioner

## 2024-01-23 ENCOUNTER — Other Ambulatory Visit (HOSPITAL_BASED_OUTPATIENT_CLINIC_OR_DEPARTMENT_OTHER): Payer: Self-pay | Admitting: Nurse Practitioner

## 2024-01-23 MED ORDER — NITROFURANTOIN MACROCRYSTAL 100 MG CAPSULE
100.0000 mg | ORAL_CAPSULE | Freq: Four times a day (QID) | ORAL | 0 refills | Status: AC
Start: 2024-01-23 — End: 2024-01-28

## 2024-01-25 NOTE — Result Encounter Note (Signed)
 01/25/2024 1010 called and notified pt to stop cephalexin and start taking nitrofurantoin that was sent to her pharmacy per d [facility]. Pt verbalized understanding

## 2024-04-29 ENCOUNTER — Other Ambulatory Visit (HOSPITAL_COMMUNITY): Payer: Self-pay | Admitting: PSYCHIATRY AND NEUROLOGY-NEUROLOGY

## 2024-04-29 DIAGNOSIS — G35 Multiple sclerosis: Secondary | ICD-10-CM

## 2024-04-29 DIAGNOSIS — R1310 Dysphagia, unspecified: Secondary | ICD-10-CM

## 2024-06-14 ENCOUNTER — Emergency Department (HOSPITAL_BASED_OUTPATIENT_CLINIC_OR_DEPARTMENT_OTHER)

## 2024-06-14 ENCOUNTER — Other Ambulatory Visit: Payer: Self-pay

## 2024-06-14 ENCOUNTER — Emergency Department
Admission: EM | Admit: 2024-06-14 | Discharge: 2024-06-14 | Disposition: A | Discharge status: Home / Self Care | Attending: Emergency Medicine | Admitting: Emergency Medicine

## 2024-06-14 ENCOUNTER — Encounter (HOSPITAL_BASED_OUTPATIENT_CLINIC_OR_DEPARTMENT_OTHER): Payer: Self-pay

## 2024-06-14 DIAGNOSIS — E860 Dehydration: Secondary | ICD-10-CM | POA: Insufficient documentation

## 2024-06-14 DIAGNOSIS — A499 Bacterial infection, unspecified: Secondary | ICD-10-CM

## 2024-06-14 DIAGNOSIS — E878 Other disorders of electrolyte and fluid balance, not elsewhere classified: Secondary | ICD-10-CM

## 2024-06-14 DIAGNOSIS — R531 Weakness: Secondary | ICD-10-CM

## 2024-06-14 DIAGNOSIS — R109 Unspecified abdominal pain: Secondary | ICD-10-CM

## 2024-06-14 DIAGNOSIS — R42 Dizziness and giddiness: Secondary | ICD-10-CM | POA: Insufficient documentation

## 2024-06-14 DIAGNOSIS — G35 Multiple sclerosis: Secondary | ICD-10-CM | POA: Insufficient documentation

## 2024-06-14 DIAGNOSIS — N3000 Acute cystitis without hematuria: Secondary | ICD-10-CM | POA: Insufficient documentation

## 2024-06-14 LAB — COMPREHENSIVE METABOLIC PANEL, NON-FASTING
ALBUMIN/GLOBULIN RATIO: 1.2 (ref 0.8–1.4)
ALBUMIN: 4 g/dL (ref 3.4–5.0)
ALKALINE PHOSPHATASE: 135 U/L — ABNORMAL HIGH (ref 46–116)
ALT (SGPT): 14 U/L (ref <=78)
ANION GAP: 10 mmol/L (ref 4–13)
AST (SGOT): 25 U/L (ref 15–37)
BILIRUBIN TOTAL: 0.8 mg/dL (ref 0.2–1.0)
BUN/CREA RATIO: 11
BUN: 9 mg/dL (ref 7–18)
CALCIUM, CORRECTED: 9.1 mg/dL
CALCIUM: 9.1 mg/dL (ref 8.5–10.1)
CHLORIDE: 103 mmol/L (ref 98–107)
CO2 TOTAL: 28 mmol/L (ref 21–32)
CREATININE: 0.82 mg/dL (ref 0.55–1.02)
ESTIMATED GFR: 84 mL/min/1.73mˆ2 (ref >59)
GLOBULIN: 3.4
GLUCOSE: 80 mg/dL (ref 74–106)
OSMOLALITY, CALCULATED: 279 mosm/kg (ref 270–290)
POTASSIUM: 4.3 mmol/L (ref 3.5–5.1)
PROTEIN TOTAL: 7.4 g/dL (ref 6.4–8.2)
SODIUM: 141 mmol/L (ref 136–145)

## 2024-06-14 LAB — CBC WITH DIFF
BASOPHIL #: 0.01 x10ˆ3/uL (ref 0.00–0.10)
BASOPHIL %: 0 % (ref 0–1)
EOSINOPHIL #: 0.09 x10ˆ3/uL (ref 0.00–0.50)
EOSINOPHIL %: 4 % (ref 1–7)
HCT: 41.8 % (ref 31.2–41.9)
HGB: 14.6 g/dL — ABNORMAL HIGH (ref 10.9–14.3)
LYMPHOCYTE #: 0.61 x10ˆ3/uL — ABNORMAL LOW (ref 1.10–3.10)
LYMPHOCYTE %: 23 % (ref 16–46)
MCH: 31 pg (ref 24.7–32.8)
MCHC: 34.9 g/dL (ref 32.3–35.6)
MCV: 88.7 fL (ref 75.5–95.3)
MONOCYTE #: 0.34 x10ˆ3/uL (ref 0.20–0.90)
MONOCYTE %: 13 % — ABNORMAL HIGH (ref 4–11)
MPV: 8.4 fL (ref 7.9–10.8)
NEUTROPHIL #: 1.6 x10ˆ3/uL — ABNORMAL LOW (ref 1.90–8.20)
NEUTROPHIL %: 60 % (ref 43–77)
PLATELETS: 213 x10ˆ3/uL (ref 140–440)
RBC: 4.71 x10ˆ6/uL (ref 3.63–4.92)
RDW: 15.1 % (ref 12.3–17.7)
WBC: 2.7 x10ˆ3/uL — ABNORMAL LOW (ref 3.8–11.8)

## 2024-06-14 LAB — PTT (PARTIAL THROMBOPLASTIN TIME): APTT: 34 s (ref 25.0–38.0)

## 2024-06-14 LAB — URINALYSIS, MACRO/MICRO
BILIRUBIN: NEGATIVE mg/dL
BLOOD: NEGATIVE mg/dL
GLUCOSE: NEGATIVE mg/dL
KETONES: NEGATIVE mg/dL
NITRITE: NEGATIVE
PH: 7.5 (ref 4.6–8.0)
PROTEIN: NEGATIVE mg/dL
SPECIFIC GRAVITY: 1.015 (ref 1.003–1.035)
UROBILINOGEN: 0.2 mg/dL (ref 0.2–1.0)

## 2024-06-14 LAB — URINALYSIS, MICROSCOPIC

## 2024-06-14 LAB — PT/INR
INR: 1.06 (ref 0.84–1.10)
PROTHROMBIN TIME: 12 s (ref 9.8–12.7)

## 2024-06-14 LAB — LIPASE: LIPASE: 55 U/L (ref 16–77)

## 2024-06-14 MED ORDER — MORPHINE 2 MG/ML INJECTION WRAPPER
INJECTION | INTRAMUSCULAR | Status: AC
Start: 2024-06-14 — End: 2024-06-14
  Filled 2024-06-14: qty 1

## 2024-06-14 MED ORDER — SODIUM CHLORIDE 0.9 % IV BOLUS
1000.0000 mL | INJECTION | Status: AC
Start: 2024-06-14 — End: 2024-06-14
  Administered 2024-06-14: 1000 mL via INTRAVENOUS
  Administered 2024-06-14: 0 mL via INTRAVENOUS

## 2024-06-14 MED ORDER — LIDOCAINE HCL 10 MG/ML (1 %) INJECTION SOLUTION
1000.0000 mg | INTRAMUSCULAR | Status: DC
Start: 2024-06-14 — End: 2024-06-24

## 2024-06-14 MED ORDER — ERTAPENEM 1 GRAM SOLUTION FOR INJECTION
INTRAMUSCULAR | Status: AC
Start: 2024-06-14 — End: 2024-06-14
  Filled 2024-06-14: qty 10

## 2024-06-14 MED ORDER — SODIUM CHLORIDE 0.9 % INTRAVENOUS SOLUTION
1000.0000 mg | INTRAVENOUS | Status: AC
Start: 2024-06-14 — End: 2024-06-14
  Administered 2024-06-14: 0 mg via INTRAVENOUS
  Administered 2024-06-14: 1000 mg via INTRAVENOUS

## 2024-06-14 MED ORDER — MORPHINE 2 MG/ML INJECTION WRAPPER
2.0000 mg | INJECTION | INTRAMUSCULAR | Status: AC
Start: 2024-06-14 — End: 2024-06-14
  Administered 2024-06-14: 2 mg via INTRAVENOUS

## 2024-06-14 MED ORDER — GENTAMICIN 40 MG/ML INJECTION SOLUTION
INTRAMUSCULAR | Status: AC
Start: 2024-06-14 — End: 2024-06-14
  Filled 2024-06-14: qty 4

## 2024-06-14 MED ORDER — SODIUM CHLORIDE 0.9 % INTRAVENOUS SOLUTION
2.0000 mg/kg | INTRAVENOUS | Status: AC
Start: 2024-06-14 — End: 2024-06-14
  Administered 2024-06-14: 0 mg via INTRAVENOUS
  Administered 2024-06-14: 120 mg via INTRAVENOUS

## 2024-06-14 NOTE — ED Nurses Note (Signed)
 MD talked to pt, and pt was ok to go daily to infusion center and get daily injections of antibiotics instead of admission. Dr Pleas talked to pharmacy and set up and pt is to call infusion center in am for schedule. I notified [facility] nursing supervisor Veleta), to let her know pt did not need admission bed at this time (per DR Pleas).

## 2024-06-14 NOTE — ED Nurses Note (Signed)
 Patient discharged home with family.  AVS reviewed with patient and patient's husband.  A written copy of the AVS and discharge instructions was given to the patient and patient's husband. Patient instructed to call the [facility] infusion center tomorrow morning to set up her IM injections of antibiotics. Understanding was verbalized. Patient was not drowsy and left department with her husband. Husband states he will be driving her home. She will come back tomorrow to get her POV. Questions sufficiently answered as needed.  Patient encouraged to follow up with PCP as indicated.  In the event of an emergency, patient instructed to call 911 or go to the nearest emergency room.

## 2024-06-14 NOTE — ED Attending Handoff Note (Signed)
 MDM:    ED Course as of 06/14/24 2146   Tue Jun 14, 2024   2143 Patient has been accepted to [facility].  I was called to bedside because she was in pain while awaiting transportation.  I reviewed key aspects of history and performed independent exam.  She denies any signs or symptoms of MS flare which for her normally manifested as blindness and extreme weakness.  She does have back pain but he is currently being treated for ESBL urinary tract infection.  As a shared decision patient will be discharged with orders to continue Invanz  effusion through [facility] infusion center.  Orders have been placed and the infusion team has been contacted.  I have asked patient to follow closely with primary care.  I stressed low threshold to return to ED.  She is comfortable with this plan.      Discharged  Clinical Impression   Dizziness   Generalized weakness   Acute cystitis without hematuria   Dehydration   ESBL (extended spectrum beta-lactamase) producing bacteria infection (Primary)     Medications Ordered/Administered in the ED   NS bolus infusion 1,000 mL (0 mL Intravenous Stopped 06/14/24 1644)   ertapenem  (INVanz ) 1,000 mg in NS 100 mL IVPB with adaptor (0 mg Intravenous Stopped 06/14/24 1648)   gentamicin  (GARAMYCIN ) 120 mg in NS 100 mL IVPB (0 mg Intravenous Stopped 06/14/24 1613)        Current Discharge Medication List        START taking these medications.        Details   ertapenem  280 mg/mL in lidocaine    1,000 mg, IntraMUSCULAR, EVERY 24 HOURS  Refills: 0            CONTINUE these medications - NO CHANGES were made during your visit.        Details   cyclobenzaprine  10 mg Tablet  Commonly known as: FLEXERIL    10 mg, Oral, 3 TIMES DAILY PRN  Qty: 12 Tablet  Refills: 0     famotidine 40 mg Tablet  Commonly known as: PEPCID   Refills: 0     FLUoxetine 20 mg Capsule  Commonly known as: PROzac   20 mg, Oral, Daily  Refills: 0     Ibuprofen  600 mg Tablet  Commonly known as: MOTRIN    600 mg, Oral, 2 TIMES DAILY  Refills: 0      ketorolac  tromethamine  10 mg Tablet  Commonly known as: TORADOL    10 mg, Oral, EVERY 6 HOURS PRN  Qty: 20 Tablet  Refills: 0     metoprolol succinate 50 mg Tablet Sustained Release 24 hr  Commonly known as: TOPROL-XL   50 mg, Oral  Refills: 0     OCREVUS IV   Infuse into a venous catheter  Refills: 0     omeprazole 40 mg Capsule, Delayed Release(E.C.)  Commonly known as: PRILOSEC   40 mg, Oral  Refills: 0     pregabalin 75 mg Capsule  Commonly known as: LYRICA   75 mg, Oral, 3 TIMES DAILY  Refills: 0     zolpidem 10 mg Tablet  Commonly known as: AMBIEN   Refills: 0

## 2024-06-14 NOTE — ED Provider Notes (Signed)
 [place], [facility] - Emergency Department  ED Primary Provider Note  History of Present Illness   Chief Complaint   Patient presents with    Pain on Urination     Patient states that she was called by her PCP and told to come in for IV antibiotics.  States she has a UTI that the antibiotic she is taking is not working.     Joanne Yoder is a 58 y.o. female who had concerns including Pain on Urination.  Arrival: The patient arrived by Car complaining of feeling lightheaded and dizzy over the past several days.  Patient states she has been feeling generalized weakness with lower back pain as well.  She is complaining of suprapubic pain presently with dysuria and increased frequency.  Patient states she has MS feels that her MS is being triggered.  Patient was treated with Cipro on the 31st of July.  Patient is resistant to the Cipro in his only sensitive to carbapenems as well as gentamicin  and tobramycin.  Patient was seen by her PMD and was told to come to the ER for IV antibiotics and admission.  She denies any fever or chills.  No nausea vomiting or diarrhea.    HPI  Review of Systems   Review of Systems   Constitutional:  Positive for activity change and appetite change. Negative for chills and fever.   HENT:  Negative for ear pain and sore throat.    Eyes:  Negative for pain and visual disturbance.   Respiratory:  Negative for cough and shortness of breath.    Cardiovascular:  Negative for chest pain and palpitations.   Gastrointestinal:  Positive for abdominal pain. Negative for vomiting.   Genitourinary:  Positive for dysuria, flank pain, frequency and urgency. Negative for hematuria.   Musculoskeletal:  Negative for arthralgias and back pain.   Skin:  Negative for color change and rash.   Neurological:  Positive for dizziness and light-headedness. Negative for seizures and syncope.   All other systems reviewed and are negative.     Historical Data   History Reviewed This  Encounter:     Physical Exam   ED Triage Vitals [06/14/24 1435]   BP (Non-Invasive) (!) 110/58   Heart Rate 59   Respiratory Rate 18   Temperature 36.7 C (98 F)   SpO2 100 %   Weight 58.7 kg (129 lb 8.3 oz)   Height 1.6 m (5' 2.99)     Physical Exam  Vitals and nursing note reviewed.   Constitutional:       General: She is not in acute distress.     Appearance: Normal appearance. She is well-developed and normal weight.   HENT:      Head: Normocephalic and atraumatic.      Right Ear: External ear normal.      Left Ear: External ear normal.      Nose: Nose normal.      Mouth/Throat:      Mouth: Mucous membranes are dry.   Eyes:      Extraocular Movements: Extraocular movements intact.      Conjunctiva/sclera: Conjunctivae normal.      Pupils: Pupils are equal, round, and reactive to light.   Cardiovascular:      Rate and Rhythm: Normal rate and regular rhythm.      Pulses: Normal pulses.      Heart sounds: Normal heart sounds. No murmur heard.  Pulmonary:      Effort: Pulmonary effort  is normal. No respiratory distress.      Breath sounds: Normal breath sounds.   Abdominal:      General: Bowel sounds are normal.      Palpations: Abdomen is soft.      Tenderness: There is abdominal tenderness. There is left CVA tenderness.      Comments: Positive tenderness left CVA area.  Positive tenderness over the suprapubic area.   Musculoskeletal:         General: No swelling. Normal range of motion.      Cervical back: Normal range of motion and neck supple.   Skin:     General: Skin is warm and dry.      Capillary Refill: Capillary refill takes less than 2 seconds.   Neurological:      General: No focal deficit present.      Mental Status: She is alert and oriented to person, place, and time.   Psychiatric:         Mood and Affect: Mood normal.         Behavior: Behavior normal.         Thought Content: Thought content normal.       Patient Data     Labs Ordered/Reviewed   COMPREHENSIVE METABOLIC PANEL, NON-FASTING -  Abnormal; Notable for the following components:       Result Value    ALKALINE PHOSPHATASE 135 (*)     All other components within normal limits    Narrative:     Estimated Glomerular Filtration Rate (eGFR) is calculated using the CKD-EPI (2021) equation, intended for patients 62 years of age and older. If gender is not documented or unknown, there will be no eGFR calculation.   CBC WITH DIFF - Abnormal; Notable for the following components:    WBC 2.7 (*)     HGB 14.6 (*)     MONOCYTE % 13 (*)     NEUTROPHIL # 1.60 (*)     LYMPHOCYTE # 0.61 (*)     All other components within normal limits   URINALYSIS, MACRO/MICRO - Abnormal; Notable for the following components:    LEUKOCYTES Small (*)     All other components within normal limits   LIPASE - Normal   PT/INR - Normal    Narrative:     In the setting of warfarin therapy, a moderate-intensity INR goal range is 2.0 to 3.0 and a high-intensity INR goal range is 2.5 to 3.5.    INR is ONLY validated to determine the level of anticoagulation with vitamin K antagonists (warfarin). Other factors may elevate the INR including but not limited to direct oral anticoagulants (DOACs), liver dysfunction, vitamin K deficiency, DIC, factor deficiencies, and factor inhibitors.   PTT (PARTIAL THROMBOPLASTIN TIME) - Normal   CBC/DIFF    Narrative:     The following orders were created for panel order CBC/DIFF.  Procedure                               Abnormality         Status                     ---------                               -----------         ------  CBC WITH DIFF[[id number]]                Abnormal            Final result                 Please view results for these tests on the individual orders.   URINALYSIS WITH REFLEX MICROSCOPIC AND CULTURE IF POSITIVE    Narrative:     The following orders were created for panel order URINALYSIS WITH REFLEX MICROSCOPIC AND CULTURE IF POSITIVE.  Procedure                               Abnormality         Status                      ---------                               -----------         ------                     URINALYSIS, MACRO/MICRO[[id number]]      Abnormal            Final result               URINALYSIS, MICROSCOPIC[[id number]]                          Final result                 Please view results for these tests on the individual orders.   URINALYSIS, MICROSCOPIC     XR ABD FLAT AND UPRIGHT SERIES (W PA CHEST)   Final Result by Edi, Radresults In (08/05 1514)   Large stool burden throughout the colon. No bowel obstruction.       clear lungs            Radiologist location ID: [id number]           Medical Decision Making        Medical Decision Making  Patient is 58 year old white female complaining left flank pain with increased frequency and dysuria for proximally 1 week.  Patient states that it has been getting worse despite being put on Cipro on the 31st of July.  She states she is also feeling lightheaded and dizziness.  Patient is complaining of generalized weakness and fatigue.  She denies any fever chills.  No nausea vomiting or diarrhea.  Patient does have a long history of MS.  She also has a history of ESBL.  Patient tested positive for E coli in his sensitive only to the carbapenems and gentamicin  or tobramycin.  Patient will have an IV placed with fluids.  She will also have labs and x-ray of her chest abdomen pelvis.  Patient will be given ertapenem  as well as gentamicin .  Patient will be treated for results and then possibly admitted to the hospital for several days of IV therapy.        I spoke to Dr. Deidre Sharps , hospitalist at [facility] by way of text who accepted the patient for transfer and admission to their facility.    Amount and/or Complexity of Data Reviewed  Labs: ordered.  Radiology: ordered.    Risk  Parenteral controlled substances.  Decision regarding hospitalization.             Medications Ordered/Administered in the ED   NS bolus infusion 1,000 mL (0 mL  Intravenous Stopped 06/14/24 1644)   ertapenem  (INVanz ) 1,000 mg in NS 100 mL IVPB with adaptor (0 mg Intravenous Stopped 06/14/24 1648)   gentamicin  (GARAMYCIN ) 120 mg in NS 100 mL IVPB (0 mg Intravenous Stopped 06/14/24 1613)     Clinical Impression   Dizziness   Generalized weakness   Acute cystitis without hematuria   Dehydration   ESBL (extended spectrum beta-lactamase) producing bacteria infection (Primary)       Disposition: Discharged               Clinical Impression   Dizziness   Generalized weakness   Acute cystitis without hematuria   Dehydration   ESBL (extended spectrum beta-lactamase) producing bacteria infection (Primary)       Discharge Medication List as of 06/14/2024 10:03 PM        START taking these medications    Details   ertapenem  280 mg/mL in lidocaine  Inject 3.6 mL (1,000 mg total) into the muscle Every 24 hours for 10 days, No Print

## 2024-06-14 NOTE — ED Nurses Note (Signed)
 Patient states that her PCP instructed her to come to ED for IV antibiotics. She states she has had a UTI for the past 2 weeks. She has been on antibiotics her PCP placed her on, however, she said they are not helping. She states her symptoms are worse. She complains of lower left back pain, rates pain 7/10. Describes pain as sharp, shooting. She also complains of urgency, frequency, and burning with urination. Pt states she has a history of M S.

## 2024-06-14 NOTE — Discharge Instructions (Signed)
 Please call the [place] infusion center tomorrow to coordinate and schedule an appointment for your antibiotic injection.  It is imperative that you continue your therapy if you have any issues return to urgent.  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  Finally you should be re-evaluated by primary care ideally within the next 3-5 days.  Thank you for visiting [facility]

## 2024-06-14 NOTE — ED Nurses Note (Signed)
 Pt resting in bed, her husband is at bedside. Pt denies any needs at this time. Call bell within reach.

## 2024-06-15 ENCOUNTER — Ambulatory Visit
Admission: RE | Admit: 2024-06-15 | Discharge: 2024-06-15 | Disposition: A | Discharge status: Home / Self Care | Source: Ambulatory Visit | Attending: Emergency Medicine | Admitting: Emergency Medicine

## 2024-06-15 ENCOUNTER — Other Ambulatory Visit (HOSPITAL_COMMUNITY): Payer: Self-pay

## 2024-06-15 ENCOUNTER — Other Ambulatory Visit (HOSPITAL_BASED_OUTPATIENT_CLINIC_OR_DEPARTMENT_OTHER): Payer: Self-pay | Admitting: Emergency Medicine

## 2024-06-15 VITALS — Temp 97.7°F | Resp 16

## 2024-06-15 DIAGNOSIS — Z1612 Extended spectrum beta lactamase (ESBL) resistance: Secondary | ICD-10-CM | POA: Insufficient documentation

## 2024-06-15 DIAGNOSIS — N390 Urinary tract infection, site not specified: Secondary | ICD-10-CM | POA: Insufficient documentation

## 2024-06-15 DIAGNOSIS — B9629 Other Escherichia coli [E. coli] as the cause of diseases classified elsewhere: Secondary | ICD-10-CM | POA: Insufficient documentation

## 2024-06-15 MED ORDER — LIDOCAINE HCL 10 MG/ML (1 %) INJECTION SOLUTION
1000.0000 mg | INTRAMUSCULAR | Status: AC
Start: 2024-06-15 — End: 2024-06-25

## 2024-06-15 MED ORDER — LIDOCAINE HCL 10 MG/ML (1 %) INJECTION SOLUTION
1000.0000 mg | INTRAMUSCULAR | Status: DC
Start: 2024-06-15 — End: 2024-06-16
  Administered 2024-06-15: 1000 mg via INTRAMUSCULAR
  Filled 2024-06-15: qty 10

## 2024-06-16 ENCOUNTER — Other Ambulatory Visit: Payer: Self-pay

## 2024-06-16 ENCOUNTER — Ambulatory Visit (HOSPITAL_BASED_OUTPATIENT_CLINIC_OR_DEPARTMENT_OTHER): Payer: Self-pay | Admitting: Family

## 2024-06-16 ENCOUNTER — Ambulatory Visit
Admission: RE | Admit: 2024-06-16 | Discharge: 2024-06-16 | Disposition: A | Discharge status: Home / Self Care | Source: Ambulatory Visit | Attending: Emergency Medicine | Admitting: Emergency Medicine

## 2024-06-16 VITALS — BP 100/40 | HR 71 | Temp 97.8°F | Resp 16

## 2024-06-16 DIAGNOSIS — N390 Urinary tract infection, site not specified: Secondary | ICD-10-CM | POA: Insufficient documentation

## 2024-06-16 DIAGNOSIS — Z1612 Extended spectrum beta lactamase (ESBL) resistance: Secondary | ICD-10-CM | POA: Insufficient documentation

## 2024-06-16 DIAGNOSIS — B9629 Other Escherichia coli [E. coli] as the cause of diseases classified elsewhere: Secondary | ICD-10-CM | POA: Insufficient documentation

## 2024-06-16 MED ORDER — LIDOCAINE HCL 10 MG/ML (1 %) INJECTION SOLUTION
1000.0000 mg | INTRAMUSCULAR | Status: DC
Start: 2024-06-16 — End: 2024-06-17
  Administered 2024-06-16: 1000 mg via INTRAMUSCULAR
  Filled 2024-06-16: qty 10

## 2024-06-17 ENCOUNTER — Ambulatory Visit
Admission: RE | Admit: 2024-06-17 | Discharge: 2024-06-17 | Disposition: A | Discharge status: Home / Self Care | Payer: Self-pay | Source: Ambulatory Visit | Attending: Emergency Medicine | Admitting: Emergency Medicine

## 2024-06-17 VITALS — BP 95/50 | HR 72 | Temp 97.8°F | Resp 17

## 2024-06-17 DIAGNOSIS — B9629 Other Escherichia coli [E. coli] as the cause of diseases classified elsewhere: Secondary | ICD-10-CM | POA: Insufficient documentation

## 2024-06-17 DIAGNOSIS — Z1612 Extended spectrum beta lactamase (ESBL) resistance: Secondary | ICD-10-CM | POA: Insufficient documentation

## 2024-06-17 DIAGNOSIS — N390 Urinary tract infection, site not specified: Secondary | ICD-10-CM | POA: Insufficient documentation

## 2024-06-17 LAB — URINE CULTURE,ROUTINE: URINE CULTURE: 10000 — AB

## 2024-06-17 MED ORDER — LIDOCAINE HCL 10 MG/ML (1 %) INJECTION SOLUTION
1000.0000 mg | INTRAMUSCULAR | Status: DC
Start: 2024-06-17 — End: 2024-06-18
  Administered 2024-06-17: 1000 mg via INTRAMUSCULAR
  Filled 2024-06-17: qty 10

## 2024-06-18 ENCOUNTER — Ambulatory Visit
Admission: RE | Admit: 2024-06-18 | Discharge: 2024-06-18 | Disposition: A | Discharge status: Home / Self Care | Payer: Self-pay | Source: Ambulatory Visit | Attending: Emergency Medicine

## 2024-06-18 VITALS — BP 105/37 | HR 70 | Temp 98.4°F | Resp 18

## 2024-06-18 DIAGNOSIS — N390 Urinary tract infection, site not specified: Secondary | ICD-10-CM | POA: Insufficient documentation

## 2024-06-18 DIAGNOSIS — B9629 Other Escherichia coli [E. coli] as the cause of diseases classified elsewhere: Secondary | ICD-10-CM | POA: Insufficient documentation

## 2024-06-18 DIAGNOSIS — Z1612 Extended spectrum beta lactamase (ESBL) resistance: Secondary | ICD-10-CM | POA: Insufficient documentation

## 2024-06-18 MED ORDER — LIDOCAINE HCL 10 MG/ML (1 %) INJECTION SOLUTION
1000.0000 mg | INTRAMUSCULAR | Status: DC
Start: 2024-06-18 — End: 2024-06-19
  Administered 2024-06-18: 1000 mg via INTRAMUSCULAR
  Filled 2024-06-18: qty 10

## 2024-06-19 ENCOUNTER — Ambulatory Visit
Admission: RE | Admit: 2024-06-19 | Discharge: 2024-06-19 | Disposition: A | Discharge status: Home / Self Care | Payer: Self-pay | Source: Ambulatory Visit | Attending: Emergency Medicine | Admitting: Emergency Medicine

## 2024-06-19 VITALS — BP 105/44 | HR 70 | Temp 98.2°F | Resp 16

## 2024-06-19 DIAGNOSIS — Z1612 Extended spectrum beta lactamase (ESBL) resistance: Secondary | ICD-10-CM | POA: Insufficient documentation

## 2024-06-19 DIAGNOSIS — B9629 Other Escherichia coli [E. coli] as the cause of diseases classified elsewhere: Secondary | ICD-10-CM | POA: Insufficient documentation

## 2024-06-19 DIAGNOSIS — N390 Urinary tract infection, site not specified: Secondary | ICD-10-CM | POA: Insufficient documentation

## 2024-06-19 MED ORDER — LIDOCAINE HCL 10 MG/ML (1 %) INJECTION SOLUTION
1000.0000 mg | INTRAMUSCULAR | Status: DC
Start: 2024-06-19 — End: 2024-06-20
  Administered 2024-06-19: 1000 mg via INTRAMUSCULAR
  Filled 2024-06-19: qty 10

## 2024-06-20 ENCOUNTER — Ambulatory Visit
Admission: RE | Admit: 2024-06-20 | Discharge: 2024-06-20 | Disposition: A | Discharge status: Home / Self Care | Payer: Self-pay | Source: Ambulatory Visit | Attending: Emergency Medicine | Admitting: Emergency Medicine

## 2024-06-20 VITALS — BP 112/63 | HR 56 | Temp 98.0°F | Resp 18

## 2024-06-20 DIAGNOSIS — N390 Urinary tract infection, site not specified: Secondary | ICD-10-CM | POA: Insufficient documentation

## 2024-06-20 DIAGNOSIS — Z1612 Extended spectrum beta lactamase (ESBL) resistance: Secondary | ICD-10-CM | POA: Insufficient documentation

## 2024-06-20 DIAGNOSIS — B9629 Other Escherichia coli [E. coli] as the cause of diseases classified elsewhere: Secondary | ICD-10-CM | POA: Insufficient documentation

## 2024-06-20 MED ORDER — LIDOCAINE HCL 10 MG/ML (1 %) INJECTION SOLUTION
1000.0000 mg | INTRAMUSCULAR | Status: DC
Start: 2024-06-20 — End: 2024-06-21
  Administered 2024-06-20: 1000 mg via INTRAMUSCULAR
  Filled 2024-06-20: qty 10

## 2024-06-21 ENCOUNTER — Ambulatory Visit
Admission: RE | Admit: 2024-06-21 | Discharge: 2024-06-21 | Disposition: A | Discharge status: Home / Self Care | Payer: Self-pay | Source: Ambulatory Visit | Attending: Emergency Medicine | Admitting: Emergency Medicine

## 2024-06-21 ENCOUNTER — Other Ambulatory Visit: Payer: Self-pay

## 2024-06-21 VITALS — BP 99/41 | HR 62 | Temp 97.8°F | Resp 17

## 2024-06-21 DIAGNOSIS — Z1612 Extended spectrum beta lactamase (ESBL) resistance: Secondary | ICD-10-CM | POA: Insufficient documentation

## 2024-06-21 DIAGNOSIS — N390 Urinary tract infection, site not specified: Secondary | ICD-10-CM | POA: Insufficient documentation

## 2024-06-21 DIAGNOSIS — B9629 Other Escherichia coli [E. coli] as the cause of diseases classified elsewhere: Secondary | ICD-10-CM | POA: Insufficient documentation

## 2024-06-21 MED ORDER — LIDOCAINE HCL 10 MG/ML (1 %) INJECTION SOLUTION
1000.0000 mg | INTRAMUSCULAR | Status: DC
Start: 2024-06-21 — End: 2024-06-22
  Administered 2024-06-21: 1000 mg via INTRAMUSCULAR
  Filled 2024-06-21: qty 10

## 2024-06-21 NOTE — Nurses Notes (Signed)
 Pt was given Invanz  IM per order in right hip. Tolerated well. No complaints or adverse reactions. DC to home.

## 2024-06-22 ENCOUNTER — Ambulatory Visit
Admission: RE | Admit: 2024-06-22 | Discharge: 2024-06-22 | Disposition: A | Discharge status: Home / Self Care | Payer: Self-pay | Source: Ambulatory Visit | Attending: Emergency Medicine | Admitting: Emergency Medicine

## 2024-06-22 VITALS — BP 101/48 | HR 58 | Temp 97.8°F | Resp 18

## 2024-06-22 DIAGNOSIS — B9629 Other Escherichia coli [E. coli] as the cause of diseases classified elsewhere: Secondary | ICD-10-CM | POA: Insufficient documentation

## 2024-06-22 DIAGNOSIS — N390 Urinary tract infection, site not specified: Secondary | ICD-10-CM | POA: Insufficient documentation

## 2024-06-22 DIAGNOSIS — Z1612 Extended spectrum beta lactamase (ESBL) resistance: Secondary | ICD-10-CM | POA: Insufficient documentation

## 2024-06-22 MED ORDER — LIDOCAINE HCL 10 MG/ML (1 %) INJECTION SOLUTION
1000.0000 mg | INTRAMUSCULAR | Status: DC
Start: 2024-06-22 — End: 2024-06-23
  Administered 2024-06-22: 1000 mg via INTRAMUSCULAR
  Filled 2024-06-22: qty 10

## 2024-06-23 ENCOUNTER — Ambulatory Visit
Admission: RE | Admit: 2024-06-23 | Discharge: 2024-06-23 | Disposition: A | Discharge status: Home / Self Care | Payer: Self-pay | Source: Ambulatory Visit | Attending: Emergency Medicine | Admitting: Emergency Medicine

## 2024-06-23 VITALS — BP 111/38 | HR 69 | Temp 98.1°F | Resp 16

## 2024-06-23 DIAGNOSIS — Z1612 Extended spectrum beta lactamase (ESBL) resistance: Secondary | ICD-10-CM | POA: Insufficient documentation

## 2024-06-23 DIAGNOSIS — N390 Urinary tract infection, site not specified: Secondary | ICD-10-CM | POA: Insufficient documentation

## 2024-06-23 DIAGNOSIS — B9629 Other Escherichia coli [E. coli] as the cause of diseases classified elsewhere: Secondary | ICD-10-CM | POA: Insufficient documentation

## 2024-06-23 MED ORDER — LIDOCAINE HCL 10 MG/ML (1 %) INJECTION SOLUTION
1000.0000 mg | INTRAMUSCULAR | Status: DC
Start: 2024-06-23 — End: 2024-06-24
  Administered 2024-06-23: 1000 mg via INTRAMUSCULAR
  Filled 2024-06-23: qty 10

## 2024-06-24 ENCOUNTER — Other Ambulatory Visit: Payer: Self-pay

## 2024-06-24 ENCOUNTER — Ambulatory Visit
Admission: RE | Admit: 2024-06-24 | Discharge: 2024-06-24 | Disposition: A | Discharge status: Home / Self Care | Payer: Self-pay | Source: Ambulatory Visit | Attending: Emergency Medicine | Admitting: Emergency Medicine

## 2024-06-24 VITALS — BP 107/31 | HR 63 | Temp 97.9°F | Resp 18

## 2024-06-24 DIAGNOSIS — B9629 Other Escherichia coli [E. coli] as the cause of diseases classified elsewhere: Secondary | ICD-10-CM | POA: Insufficient documentation

## 2024-06-24 DIAGNOSIS — N390 Urinary tract infection, site not specified: Secondary | ICD-10-CM | POA: Insufficient documentation

## 2024-06-24 DIAGNOSIS — Z1612 Extended spectrum beta lactamase (ESBL) resistance: Secondary | ICD-10-CM | POA: Insufficient documentation

## 2024-06-24 MED ORDER — LIDOCAINE HCL 10 MG/ML (1 %) INJECTION SOLUTION
1000.0000 mg | INTRAMUSCULAR | Status: DC
Start: 2024-06-24 — End: 2024-06-25
  Administered 2024-06-24: 1000 mg via INTRAMUSCULAR
  Filled 2024-06-24 (×2): qty 10

## 2024-06-24 NOTE — Nurses Notes (Signed)
 Patient tolerated injection well. Today completes ordered tx.

## 2024-06-24 NOTE — Nurses Notes (Signed)
 Patient stated her BP runs low. I wrote down each of her BP readings and gave to patient to give to her PCP

## 2024-07-04 ENCOUNTER — Ambulatory Visit (HOSPITAL_COMMUNITY): Payer: Self-pay

## 2024-08-25 ENCOUNTER — Other Ambulatory Visit: Payer: Self-pay

## 2024-08-25 ENCOUNTER — Ambulatory Visit
Admission: RE | Admit: 2024-08-25 | Discharge: 2024-08-25 | Disposition: A | Discharge status: Home / Self Care | Payer: Self-pay | Source: Ambulatory Visit | Attending: PSYCHIATRY AND NEUROLOGY-NEUROLOGY | Admitting: PSYCHIATRY AND NEUROLOGY-NEUROLOGY

## 2024-08-25 DIAGNOSIS — G35D Multiple sclerosis, unspecified: Secondary | ICD-10-CM | POA: Insufficient documentation

## 2024-08-25 DIAGNOSIS — R1310 Dysphagia, unspecified: Secondary | ICD-10-CM | POA: Insufficient documentation

## 2024-08-25 MED ORDER — BARIUM SULFATE 81 % (W/W) ORAL POWDER
30.0000 mL | ORAL | Status: AC
Start: 2024-08-25 — End: 2024-08-25
  Administered 2024-08-25: 30 mL via ORAL

## 2024-08-25 MED ORDER — BARIUM SULFATE 40 % (W/V), 30% (W/W) ORAL PASTE
30.0000 mL | PASTE | ORAL | Status: AC
Start: 2024-08-25 — End: 2024-08-25
  Administered 2024-08-25: 30 mL via ORAL

## 2024-08-25 MED ORDER — BARIUM SULFATE 40 % (W/V), 30 % (W/W) ORAL SUSPENSION
30.0000 mL | ORAL | Status: AC
Start: 2024-08-25 — End: 2024-08-25
  Administered 2024-08-25: 30 mL via ORAL

## 2024-08-26 NOTE — Care Plan (Signed)
 [facility]  [address]  [place], [address]  ([phone]  (Fax) [phone]  Rehabilitation Services  Speech Therapy Modified Barium Swallow Study (MBSS)          Patient Name: Joanne Yoder  Date of Birth: March 02, 1966  Weight:     Room/Bed: Room/bed info not found  Payor: PEIA / Plan: PEIA/UMR / Product Type: Non Managed Care /       Assessment:   Impressions: Dysphagia Oral: Mild      Plan:   Recommendations - Diet: Minced & Moist  Recommendations - Liquid: Level 0 Liquids: Regular/Thin  Aspiration Precautions: Small sips, Meds in applesauce, Straw-yes, and Chin tuck  Treatment Strategies: None warranted  Other Recommendations: f/u with neurology  Results & Recommendations Discussed With:Patient and MD     Subjective:     Alert:yes  Cooperative:yes  Follows Directions:yes  Dentition:Dentures   Respiratory status:Room Air   Nutrition:PO diet reports gained weight recently  Objective:      Past Medical History:   Diagnosis Date    Anxiety     Asthma     Depression     Multiple sclerosis     RLS (restless legs syndrome)             08/25/24 1000   Rehab Session   Document Type evaluation   SLP Visit Date 08/25/24   Total SLP Minutes: 30   Patient Effort good   General Information   Patient Profile Reviewed yes   Referring Physician Dr. Milissa   Patient/Family/Caregiver Comments/Observations c/o liquids coming ou of her nose, c/o foods not going down, spitting back up at times   General Observations of Patient mild right side weakness   Limitations/Impairments swallowing   Mutuality/Individual Preferences   Anxieties, Fears or Concerns getting choked   Plan of Care Reviewed With patient   Functional Status Prior   Swallowing 2 - difficulty swallowing liquids/foods   Cognition   Orientation Status oriented x 4   Follows Commands WNL   Swallow Assessment/Intervention   Additional Documentation Dysphagia/Swallowing Interventions (Group)   Dysphagia/Swallowing  Interventions   Compensatory Strategies (Swallow) uses a chin tuck, unable to extend head posterior during swallow or she gets choked or liquid enters the nasal cavity   Oxygenation   Oxygenation Room air   Oral Motor Structure and Function    Additional Documentation   [facility])   Videofluoroscopic Swallowing Exam (VFSS)   Additional Documentation Thin Liquid Trial (VFSS) (Group);Nectar Liquid Trial (VFSS) (Group);Puree Trial (VFSS) (Group);Semi-Solid Trial (VFSS) (Group)   Thin Liquid Trial (VFSS)   Mode of Presentation, Thin Liquid (VFSS) fed by clinician;cup;spoon;straw   Volume Presented in mL, Thin Liquid (VFSS) 5 mL;10 mL;patient controlled volumes   Oral Phase Results, Thin Liquid (VFSS) impaired oral phase, signs of dysfunction present   Oral Transit Impairment, Thin Liquid (VFSS) reduced lingual control;premature spillage into pharynx   Oral Control Impairments, Thin Liquid (VFSS) formation of cohesive bolus is impaired;lingual-velar seal is impaired   Pharyngeal Phase Results, Thin Liquid (VFSS) impaired pharyngeal phase of swallowing   Pharyngeal Phase Impairments, Thin Liquid (VFSS) material reaches pyriform sinuses prior to swallow response   Identified Consequences of Impaired Swallow Physiology, Thin Liquid (VFSS) airway compromise;posterior bolus spillage   Attempted Compensatory Maneuvers, Thin Liquid (VFSS) chin down position;effortful (hard) swallow   Intended Functional Outcomes of Compensatory Maneuvers, Thin Liquid (VFSS) improved airway protection;increase swallow efficiency   Comment, Thin Liquid (VFSS) due to premature spillage  and delay initiation of swallow at the level of the pyriform sinus, pt at increased risk for aspiration.   Nectar Liquid Trial (VFSS)   Volume Presented in mL, Nectar Thick (VFSS) 5 mL   Oral Transit Impairment, Nectar Thick (VFSS) propulsion of bolus into the oropharynx is impaired;premature spillage into pharynx   Oral Control Impairments, Nectar Thick (VFSS) oral  containment of liquids impaired posteriorly   Pharyngeal Phase Results, Nectar Thick (VFSS) safe swallow, no signs/symptoms of aspiration or penetration  (increased risk)   Successful Compensatory Maneuvers, Nectar Thick (VFSS)   (small sips recommended, difficulty managing a cohesive bolus)   Comment, Nectar Thick (VFSS) no significant change between NTL and thin liquids   Puree Trial (VFSS)   Mode of Presentation, Puree (VFSS) fed by clinician;spoon   Volume Presented in mL, Puree (VFSS) 5 mL   Oral Phase Results, Puree (VFSS) intact oral phase without signs of dysfunction   Pharyngeal Phase Results, Puree (VFSS) safe swallow, no signs/symptoms of aspiration or penetration   Comment, Puree (VFSS) WFL with puree consistency   Semi-Solid Trial (VFSS)   Mode of Presentation, Semi-solid (VFSS) spoon   Volume Presented in mL, Semi-solid (VFSS) 5 mL   Oral Phase Results, Semi-solid (VFSS) intact oral phase without signs of dysfunction   Pharyngeal Phase Results, Semi-solid (VFSS) safe swallow, no signs/symptoms of aspiration or penetration   Comment, Semi-solid (VFSS) extra time for mastication, recommend minced/moist diet   SLP Clinical Impression   Assessment Based on MBS, pt has difficulty managing a cohesive bolus with thin liquids. Recommend small bolus size, effortful swallow. Chin tuck did not improve symptoms. Puree was WNL and solid was [facility] given extra time for mastication. Pt reports unable to extend head posterioly when drinking due to choking. The findings of the MBS support her c/o due to premature spillage and delayed initation of swallow at the level of the pyriform sinus. Recommend thin liquids, small sips, can use straw but avoid sequential swallows. Minced and moist diet level. No overt s/s of aspiration , however at risk with larger volume of thin liquids.   SLP Diagnosis mild dysphagia   Swallowing Clinical Impression   Therapy Frequency evaluation only   SLP Diet Recommendation thin  liquids;mechanical soft   Signs/Symptoms of Aspiration Noted (Swallowing) none   Plan of care reviewed with: Patient   Eating/Swallowing Management Strategies/Techniques   Strategies to Enhance Eating/Swallowing allow adequate time for eating;frequent small meals encouraged due to early satiety   Discharge Summary (SLP)   Reason for Discharge (SLP) no further needs identified   Discharge Summary Statement (SLP) MBS completed, results reviewed wtih patient         Therapist:     Saddie Latina, SLP,08/25/2024,14:39      Total Session Time 30

## 2024-10-03 ENCOUNTER — Ambulatory Visit (HOSPITAL_COMMUNITY): Payer: Self-pay

## 2025-01-06 ENCOUNTER — Ambulatory Visit (INDEPENDENT_AMBULATORY_CARE_PROVIDER_SITE_OTHER): Payer: Self-pay | Admitting: Physician Assistant
